# Patient Record
Sex: Female | Born: 1955
Health system: Southern US, Community
[De-identification: ages and names within clinical notes are randomized; demographics above are authoritative.]

## PROBLEM LIST (undated history)

## (undated) DIAGNOSIS — E785 Hyperlipidemia, unspecified: Secondary | ICD-10-CM

## (undated) DIAGNOSIS — F331 Major depressive disorder, recurrent, moderate: Secondary | ICD-10-CM

## (undated) DIAGNOSIS — R5382 Chronic fatigue, unspecified: Secondary | ICD-10-CM

## (undated) DIAGNOSIS — I10 Essential (primary) hypertension: Secondary | ICD-10-CM

## (undated) DIAGNOSIS — K589 Irritable bowel syndrome without diarrhea: Secondary | ICD-10-CM

## (undated) DIAGNOSIS — E113293 Type 2 diabetes mellitus with mild nonproliferative diabetic retinopathy without macular edema, bilateral: Secondary | ICD-10-CM

## (undated) DIAGNOSIS — M1990 Unspecified osteoarthritis, unspecified site: Secondary | ICD-10-CM

## (undated) DIAGNOSIS — E119 Type 2 diabetes mellitus without complications: Secondary | ICD-10-CM

## (undated) DIAGNOSIS — IMO0002 Reserved for concepts with insufficient information to code with codable children: Secondary | ICD-10-CM

## (undated) DIAGNOSIS — Z6841 Body Mass Index (BMI) 40.0 and over, adult: Secondary | ICD-10-CM

## (undated) DIAGNOSIS — K5792 Diverticulitis of intestine, part unspecified, without perforation or abscess without bleeding: Secondary | ICD-10-CM

## (undated) DIAGNOSIS — J309 Allergic rhinitis, unspecified: Secondary | ICD-10-CM

## (undated) DIAGNOSIS — M329 Systemic lupus erythematosus, unspecified: Secondary | ICD-10-CM

## (undated) HISTORY — DX: Morbid (severe) obesity due to excess calories: E66.01

## (undated) HISTORY — DX: Type 2 diabetes mellitus with mild nonproliferative diabetic retinopathy without macular edema, bilateral: E11.3293

## (undated) HISTORY — DX: Chronic fatigue, unspecified: R53.82

## (undated) HISTORY — PX: MYOMECTOMY: SHX85

## (undated) HISTORY — DX: Diverticulitis of intestine, part unspecified, without perforation or abscess without bleeding: K57.92

## (undated) HISTORY — DX: Irritable bowel syndrome, unspecified: K58.9

## (undated) HISTORY — DX: Hyperlipidemia, unspecified: E78.5

## (undated) HISTORY — PX: CHOLECYSTECTOMY: SHX55

## (undated) HISTORY — DX: Essential (primary) hypertension: I10

## (undated) HISTORY — DX: Major depressive disorder, recurrent, moderate: F33.1

## (undated) HISTORY — DX: Unspecified osteoarthritis, unspecified site: M19.90

## (undated) HISTORY — DX: Systemic lupus erythematosus, unspecified: M32.9

## (undated) HISTORY — DX: Allergic rhinitis, unspecified: J30.9

## (undated) HISTORY — DX: Body Mass Index (BMI) 40.0 and over, adult: Z684

## (undated) HISTORY — PX: TONSILLECTOMY: SUR1361

## (undated) HISTORY — PX: DIAGNOSTIC LAPAROSCOPY: SUR761

## (undated) HISTORY — DX: Reserved for concepts with insufficient information to code with codable children: IMO0002

---

## 2003-12-05 ENCOUNTER — Emergency Department (HOSPITAL_COMMUNITY): Admission: EM | Admit: 2003-12-05 | Discharge: 2003-12-06 | Payer: Self-pay | Admitting: Emergency Medicine

## 2003-12-21 ENCOUNTER — Encounter: Admission: RE | Admit: 2003-12-21 | Discharge: 2003-12-21 | Payer: Self-pay | Admitting: Internal Medicine

## 2004-01-09 ENCOUNTER — Encounter: Admission: RE | Admit: 2004-01-09 | Discharge: 2004-01-09 | Payer: Self-pay | Admitting: Internal Medicine

## 2004-03-07 ENCOUNTER — Ambulatory Visit: Admission: RE | Admit: 2004-03-07 | Discharge: 2004-03-07 | Payer: Self-pay | Admitting: Internal Medicine

## 2004-09-09 ENCOUNTER — Encounter: Admission: RE | Admit: 2004-09-09 | Discharge: 2004-09-09 | Payer: Self-pay | Admitting: Internal Medicine

## 2004-10-01 ENCOUNTER — Encounter: Admission: RE | Admit: 2004-10-01 | Discharge: 2004-10-01 | Payer: Self-pay | Admitting: Internal Medicine

## 2004-10-07 ENCOUNTER — Ambulatory Visit (HOSPITAL_COMMUNITY): Admission: RE | Admit: 2004-10-07 | Discharge: 2004-10-07 | Payer: Self-pay | Admitting: Gastroenterology

## 2004-10-07 ENCOUNTER — Encounter (INDEPENDENT_AMBULATORY_CARE_PROVIDER_SITE_OTHER): Payer: Self-pay | Admitting: *Deleted

## 2005-01-05 ENCOUNTER — Encounter: Admission: RE | Admit: 2005-01-05 | Discharge: 2005-01-05 | Payer: Self-pay | Admitting: Internal Medicine

## 2005-01-27 ENCOUNTER — Encounter: Admission: RE | Admit: 2005-01-27 | Discharge: 2005-04-27 | Payer: Self-pay | Admitting: Internal Medicine

## 2006-01-12 ENCOUNTER — Emergency Department (HOSPITAL_COMMUNITY): Admission: EM | Admit: 2006-01-12 | Discharge: 2006-01-12 | Payer: Self-pay | Admitting: Family Medicine

## 2006-01-22 ENCOUNTER — Encounter: Admission: RE | Admit: 2006-01-22 | Discharge: 2006-01-22 | Payer: Self-pay | Admitting: Internal Medicine

## 2006-03-28 ENCOUNTER — Encounter: Admission: RE | Admit: 2006-03-28 | Discharge: 2006-03-28 | Payer: Self-pay | Admitting: Orthopedic Surgery

## 2006-03-31 ENCOUNTER — Encounter: Admission: RE | Admit: 2006-03-31 | Discharge: 2006-03-31 | Payer: Self-pay | Admitting: Orthopedic Surgery

## 2006-08-19 ENCOUNTER — Encounter: Admission: RE | Admit: 2006-08-19 | Discharge: 2006-08-19 | Payer: Self-pay | Admitting: Rheumatology

## 2006-08-31 ENCOUNTER — Encounter: Admission: RE | Admit: 2006-08-31 | Discharge: 2006-08-31 | Payer: Self-pay | Admitting: *Deleted

## 2007-10-10 ENCOUNTER — Encounter: Admission: RE | Admit: 2007-10-10 | Discharge: 2007-10-10 | Payer: Self-pay | Admitting: Rheumatology

## 2007-10-19 ENCOUNTER — Encounter: Admission: RE | Admit: 2007-10-19 | Discharge: 2007-10-19 | Payer: Self-pay | Admitting: Family Medicine

## 2008-06-13 ENCOUNTER — Encounter: Admission: RE | Admit: 2008-06-13 | Discharge: 2008-07-11 | Payer: Self-pay | Admitting: Endocrinology

## 2008-09-03 ENCOUNTER — Encounter: Admission: RE | Admit: 2008-09-03 | Discharge: 2008-09-03 | Payer: Self-pay | Admitting: Internal Medicine

## 2008-12-19 ENCOUNTER — Other Ambulatory Visit: Admission: RE | Admit: 2008-12-19 | Discharge: 2008-12-19 | Payer: Self-pay | Admitting: Internal Medicine

## 2009-05-14 ENCOUNTER — Emergency Department (HOSPITAL_COMMUNITY): Admission: EM | Admit: 2009-05-14 | Discharge: 2009-05-14 | Payer: Self-pay | Admitting: Emergency Medicine

## 2009-08-29 ENCOUNTER — Encounter: Admission: RE | Admit: 2009-08-29 | Discharge: 2009-08-29 | Payer: Self-pay | Admitting: Orthopedic Surgery

## 2010-05-21 ENCOUNTER — Encounter
Admission: RE | Admit: 2010-05-21 | Discharge: 2010-05-21 | Payer: Self-pay | Source: Home / Self Care | Attending: Internal Medicine | Admitting: Internal Medicine

## 2010-05-31 LAB — LAB REPORT - SCANNED: EGFR: 60

## 2010-09-12 NOTE — Op Note (Signed)
Stacy Dickerson, Stacy Dickerson                 ACCOUNT NO.:  [id number]   MEDICAL RECORD NO.:  [id number]          PATIENT TYPE:  AMB   LOCATION:  ENDO                         FACILITY:  [facility]   PHYSICIAN:  James L. Malon Kindle., M.D.DATE OF BIRTH:  12-13-1955   DATE OF PROCEDURE:  DATE OF DISCHARGE:                                 OPERATIVE REPORT   Audio too short to transcribe (less than 5 seconds)       JLE/MEDQ  D:  10/07/2004  T:  10/07/2004  Job:  [id number]

## 2010-09-12 NOTE — Op Note (Signed)
NAMEKEVIA, Stacy Dickerson                 ACCOUNT NO.:  [id number]   MEDICAL RECORD NO.:  [id number]          PATIENT TYPE:  AMB   LOCATION:  ENDO                         FACILITY:  [facility]   PHYSICIAN:  James L. Malon Kindle., M.D.DATE OF BIRTH:  1955/07/19   DATE OF PROCEDURE:  10/07/2004  DATE OF DISCHARGE:                                 OPERATIVE REPORT   PROCEDURE:  Colonoscopy and biopsy.   MEDICATIONS:  The patient received a total of fentanyl 115 mcg, Versed 1.5  mg IV.   INDICATION:  Persistent diarrhea and weight loss.  This is done to look for  a colonic source.   DESCRIPTION OF PROCEDURE:  The procedure was explained to the patient and  consent obtained.  With the patient in the left lateral decubitus position,  the Olympus pediatric adjustable scope was inserted.  Prep excellent.  We  advanced to the transverse colon.  Despite multiple maneuvers, could not  advance further.  We then withdrew the scope and inserted the adult  adjustable scope and were able to advance to the same position.  Finally  with the patient in the right lateral decubitus position, we were able to  advance down with obesity.  The scope was withdrawn and mucosa was normal  throughout.  No polyps seen.  No diverticular disease.  Multiple random  biopsies obtained.  Diarrhea grossly.  Random biopsies obtained, 787.91.   PLAN:  Will check pathology and recommend repeating office visit in 3-4  weeks.       JLE/MEDQ  D:  10/07/2004  T:  10/07/2004  Job:  [id number]   cc:   Darius Bump, M.D.  [place]  [place] [address]  Fax: [phone]

## 2010-09-12 NOTE — Op Note (Signed)
Stacy Dickerson, Stacy Dickerson                 ACCOUNT NO.:  [id number]   MEDICAL RECORD NO.:  [id number]          PATIENT TYPE:  AMB   LOCATION:  ENDO                         FACILITY:  [facility]   PHYSICIAN:  James L. Malon Kindle., M.D.DATE OF BIRTH:  1955-05-11   DATE OF PROCEDURE:  10/07/2004  DATE OF DISCHARGE:                                 OPERATIVE REPORT   PROCEDURE:  Esophagogastroduodenoscopy.   MEDICATIONS:  Fentanyl 75 mcg and Versed 8 mg IV.   INDICATIONS FOR PROCEDURE:  Persistent dyspepsia despite the use of Nexium.   DESCRIPTION OF PROCEDURE:  Procedure had been explained to the patient and  consent obtained.  In the left lateral decubitus position, the Olympus scope  was inserted and advanced.  The prep was excellent.  Stomach entered.  Pylorus identified and passed.  Duodenum including the bulb and second  portion were unremarkable.  Pyloric channel completely normal.  Antrum and  body of the stomach normal.  Fundus and cardia seen well on the retroflexed  view and are normal.  A 2 to 3 cm hiatal hernia distal proximal esophagus  endoscopically normal.  No signs of Barrett's esophagus.  Scope withdrawn.  Patient tolerated the procedure well.   ASSESSMENT:  Gastroesophageal reflux disease, 530.81.   PLAN:  Will continue Nexium, give reflux instruction sheet and proceed with  colonoscopy at this time.       JLE/MEDQ  D:  10/07/2004  T:  10/07/2004  Job:  [id number]   cc:   Darius Bump, M.D.  [place]  [place] [address]  Fax: [phone]

## 2011-03-06 ENCOUNTER — Other Ambulatory Visit: Payer: Self-pay | Admitting: Internal Medicine

## 2011-03-06 DIAGNOSIS — R1900 Intra-abdominal and pelvic swelling, mass and lump, unspecified site: Secondary | ICD-10-CM

## 2011-03-11 ENCOUNTER — Ambulatory Visit
Admission: RE | Admit: 2011-03-11 | Discharge: 2011-03-11 | Disposition: A | Discharge status: Home / Self Care | Payer: 59 | Source: Ambulatory Visit | Attending: Internal Medicine | Admitting: Internal Medicine

## 2011-03-11 DIAGNOSIS — R1900 Intra-abdominal and pelvic swelling, mass and lump, unspecified site: Secondary | ICD-10-CM

## 2011-04-14 ENCOUNTER — Other Ambulatory Visit: Payer: Self-pay | Admitting: Pulmonary Disease

## 2011-04-14 ENCOUNTER — Other Ambulatory Visit: Payer: Self-pay | Admitting: Internal Medicine

## 2011-04-14 DIAGNOSIS — Z1231 Encounter for screening mammogram for malignant neoplasm of breast: Secondary | ICD-10-CM

## 2011-05-12 ENCOUNTER — Other Ambulatory Visit: Payer: Self-pay | Admitting: Occupational Medicine

## 2011-05-12 ENCOUNTER — Other Ambulatory Visit: Payer: Self-pay | Admitting: *Deleted

## 2011-05-12 ENCOUNTER — Ambulatory Visit
Admission: RE | Admit: 2011-05-12 | Discharge: 2011-05-12 | Disposition: A | Discharge status: Home / Self Care | Payer: 59 | Source: Ambulatory Visit | Attending: *Deleted | Admitting: *Deleted

## 2011-05-12 DIAGNOSIS — M25572 Pain in left ankle and joints of left foot: Secondary | ICD-10-CM

## 2011-05-25 ENCOUNTER — Ambulatory Visit
Admission: RE | Admit: 2011-05-25 | Discharge: 2011-05-25 | Disposition: A | Discharge status: Home / Self Care | Payer: 59 | Source: Ambulatory Visit | Attending: Internal Medicine | Admitting: Internal Medicine

## 2011-05-25 DIAGNOSIS — Z1231 Encounter for screening mammogram for malignant neoplasm of breast: Secondary | ICD-10-CM

## 2012-05-05 ENCOUNTER — Other Ambulatory Visit: Payer: Self-pay | Admitting: Internal Medicine

## 2012-05-05 DIAGNOSIS — Z1231 Encounter for screening mammogram for malignant neoplasm of breast: Secondary | ICD-10-CM

## 2012-05-25 ENCOUNTER — Ambulatory Visit: Payer: 59

## 2012-05-31 ENCOUNTER — Other Ambulatory Visit (HOSPITAL_COMMUNITY)
Admission: RE | Admit: 2012-05-31 | Discharge: 2012-05-31 | Disposition: A | Discharge status: Home / Self Care | Payer: 59 | Source: Ambulatory Visit | Attending: Internal Medicine | Admitting: Internal Medicine

## 2012-05-31 ENCOUNTER — Other Ambulatory Visit: Payer: Self-pay | Admitting: Registered Nurse

## 2012-05-31 ENCOUNTER — Ambulatory Visit
Admission: RE | Admit: 2012-05-31 | Discharge: 2012-05-31 | Disposition: A | Discharge status: Home / Self Care | Payer: 59 | Source: Ambulatory Visit | Attending: Internal Medicine | Admitting: Internal Medicine

## 2012-05-31 DIAGNOSIS — Z01419 Encounter for gynecological examination (general) (routine) without abnormal findings: Secondary | ICD-10-CM | POA: Insufficient documentation

## 2012-05-31 DIAGNOSIS — Z1231 Encounter for screening mammogram for malignant neoplasm of breast: Secondary | ICD-10-CM

## 2013-04-27 HISTORY — PX: BREAST BIOPSY: SHX20

## 2013-05-12 ENCOUNTER — Other Ambulatory Visit: Payer: Self-pay

## 2013-05-12 DIAGNOSIS — Z1231 Encounter for screening mammogram for malignant neoplasm of breast: Secondary | ICD-10-CM

## 2013-06-02 ENCOUNTER — Ambulatory Visit
Admission: RE | Admit: 2013-06-02 | Discharge: 2013-06-02 | Disposition: A | Discharge status: Home / Self Care | Payer: 59 | Source: Ambulatory Visit

## 2013-06-02 DIAGNOSIS — Z1231 Encounter for screening mammogram for malignant neoplasm of breast: Secondary | ICD-10-CM

## 2013-06-06 ENCOUNTER — Other Ambulatory Visit: Payer: Self-pay | Admitting: Family Medicine

## 2013-06-06 DIAGNOSIS — R928 Other abnormal and inconclusive findings on diagnostic imaging of breast: Secondary | ICD-10-CM

## 2013-06-09 ENCOUNTER — Ambulatory Visit
Admission: RE | Admit: 2013-06-09 | Discharge: 2013-06-09 | Disposition: A | Discharge status: Home / Self Care | Payer: 59 | Source: Ambulatory Visit | Attending: Family Medicine | Admitting: Family Medicine

## 2013-06-09 ENCOUNTER — Other Ambulatory Visit: Payer: Self-pay | Admitting: Family Medicine

## 2013-06-09 DIAGNOSIS — R928 Other abnormal and inconclusive findings on diagnostic imaging of breast: Secondary | ICD-10-CM

## 2013-06-09 DIAGNOSIS — R2230 Localized swelling, mass and lump, unspecified upper limb: Secondary | ICD-10-CM

## 2013-06-13 ENCOUNTER — Other Ambulatory Visit (HOSPITAL_COMMUNITY): Payer: Self-pay | Admitting: Diagnostic Radiology

## 2013-06-13 ENCOUNTER — Ambulatory Visit
Admission: RE | Admit: 2013-06-13 | Discharge: 2013-06-13 | Disposition: A | Discharge status: Home / Self Care | Payer: 59 | Source: Ambulatory Visit | Attending: Family Medicine | Admitting: Family Medicine

## 2013-06-13 DIAGNOSIS — R2230 Localized swelling, mass and lump, unspecified upper limb: Secondary | ICD-10-CM

## 2013-06-23 ENCOUNTER — Other Ambulatory Visit: Payer: Self-pay | Admitting: Family Medicine

## 2013-06-23 ENCOUNTER — Ambulatory Visit
Admission: RE | Admit: 2013-06-23 | Discharge: 2013-06-23 | Disposition: A | Discharge status: Home / Self Care | Payer: 59 | Source: Ambulatory Visit | Attending: Family Medicine | Admitting: Family Medicine

## 2013-06-23 DIAGNOSIS — R2230 Localized swelling, mass and lump, unspecified upper limb: Secondary | ICD-10-CM

## 2014-06-12 ENCOUNTER — Other Ambulatory Visit: Payer: Self-pay

## 2014-06-12 DIAGNOSIS — Z1231 Encounter for screening mammogram for malignant neoplasm of breast: Secondary | ICD-10-CM

## 2014-06-25 ENCOUNTER — Ambulatory Visit
Admission: RE | Admit: 2014-06-25 | Discharge: 2014-06-25 | Disposition: A | Discharge status: Home / Self Care | Payer: 59 | Source: Ambulatory Visit

## 2014-06-25 DIAGNOSIS — Z1231 Encounter for screening mammogram for malignant neoplasm of breast: Secondary | ICD-10-CM

## 2014-07-20 LAB — LAB REPORT - SCANNED: EGFR: 89

## 2014-08-13 ENCOUNTER — Other Ambulatory Visit: Payer: Self-pay | Admitting: Rheumatology

## 2014-08-13 ENCOUNTER — Ambulatory Visit (HOSPITAL_COMMUNITY)
Admission: RE | Admit: 2014-08-13 | Discharge: 2014-08-13 | Disposition: A | Discharge status: Home / Self Care | Payer: 59 | Source: Ambulatory Visit | Attending: Rheumatology | Admitting: Rheumatology

## 2014-08-13 DIAGNOSIS — R748 Abnormal levels of other serum enzymes: Secondary | ICD-10-CM

## 2014-08-13 DIAGNOSIS — R599 Enlarged lymph nodes, unspecified: Secondary | ICD-10-CM

## 2014-08-13 DIAGNOSIS — R591 Generalized enlarged lymph nodes: Secondary | ICD-10-CM | POA: Insufficient documentation

## 2014-08-13 DIAGNOSIS — R749 Abnormal serum enzyme level, unspecified: Secondary | ICD-10-CM | POA: Diagnosis not present

## 2014-11-28 ENCOUNTER — Other Ambulatory Visit: Payer: Self-pay | Admitting: Family Medicine

## 2014-11-28 ENCOUNTER — Ambulatory Visit
Admission: RE | Admit: 2014-11-28 | Discharge: 2014-11-28 | Disposition: A | Discharge status: Home / Self Care | Payer: 59 | Source: Ambulatory Visit | Attending: Family Medicine | Admitting: Family Medicine

## 2014-11-28 DIAGNOSIS — R52 Pain, unspecified: Secondary | ICD-10-CM

## 2014-12-07 ENCOUNTER — Encounter: Payer: Self-pay | Admitting: Podiatry

## 2014-12-07 ENCOUNTER — Ambulatory Visit (INDEPENDENT_AMBULATORY_CARE_PROVIDER_SITE_OTHER): Payer: 59

## 2014-12-07 ENCOUNTER — Ambulatory Visit (INDEPENDENT_AMBULATORY_CARE_PROVIDER_SITE_OTHER): Payer: 59 | Admitting: Podiatry

## 2014-12-07 VITALS — BP 127/70 | HR 71 | Resp 16 | Ht 67.0 in | Wt 295.0 lb

## 2014-12-07 DIAGNOSIS — M79672 Pain in left foot: Secondary | ICD-10-CM | POA: Diagnosis not present

## 2014-12-07 DIAGNOSIS — M779 Enthesopathy, unspecified: Secondary | ICD-10-CM

## 2014-12-07 DIAGNOSIS — Z0189 Encounter for other specified special examinations: Secondary | ICD-10-CM | POA: Diagnosis not present

## 2014-12-07 DIAGNOSIS — M722 Plantar fascial fibromatosis: Secondary | ICD-10-CM

## 2014-12-07 MED ORDER — TRIAMCINOLONE ACETONIDE 10 MG/ML IJ SUSP
10.0000 mg | Freq: Once | INTRAMUSCULAR | Status: AC
  Administered 2014-12-07: 10 mg

## 2014-12-07 NOTE — Progress Notes (Signed)
   Subjective:    Patient ID: Stacy Dickerson, female    DOB: 1956/04/20, 59 y.o.   MRN: [id number]  HPI Patient presents with foot pain, left foot, heel and ankle pain. Ankle pain has been going on for past 3 weeks. Heel pain going on for more than 2 months.   Review of Systems  Constitutional: Positive for chills, diaphoresis, fatigue and unexpected weight change.  Gastrointestinal: Positive for diarrhea and abdominal distention.  Endocrine: Positive for polydipsia.  Musculoskeletal: Positive for back pain, arthralgias and gait problem.  Allergic/Immunologic: Positive for environmental allergies.  Neurological: Positive for light-headedness.  All other systems reviewed and are negative.      Objective:   Physical Exam        Assessment & Plan:

## 2014-12-07 NOTE — Patient Instructions (Signed)

## 2014-12-09 NOTE — Progress Notes (Signed)
Subjective:     Patient ID: Stacy Dickerson, female   DOB: 1955-08-19, 59 y.o.   MRN: [id number]  HPI patient presents stating she's been getting a lot of pain in her left heel her left ankle in her left foot that's been going on for several months with a worsening over the last month. States she does not remember specific injury   Review of Systems  All other systems reviewed and are negative.      Objective:   Physical Exam  Constitutional: She is oriented to person, place, and time.  Cardiovascular: Intact distal pulses.   Musculoskeletal: Normal range of motion.  Neurological: She is oriented to person, place, and time.  Skin: Skin is warm.  Nursing note and vitals reviewed.  neurovascular status intact muscle strength adequate range of motion was mildly restricted on the left side due to discomfort. Patient has mild equinus condition good digital perfusion is well oriented 3 and I noted quite a bit of discomfort in the left heel left lateral foot and also into the left lateral ankle. Patient does not again remember specific injury     Assessment:     Inflammatory fasciitis left along with tendinitis-like condition possible ankle sprain or other osteoarthritic possibility    Plan:     H&P and multiple view x-rays reviewed. Today I injected the plantar fascia 3 mg Kenalog 5 mill grams Xylocaine to reduce inflammation and placed patient into an air fracture walker and order to mobilize. Reappoint for [place] to recheck again in the next 4 weeks or earlier if any issues should occur

## 2015-01-07 ENCOUNTER — Ambulatory Visit (INDEPENDENT_AMBULATORY_CARE_PROVIDER_SITE_OTHER): Payer: 59 | Admitting: Podiatry

## 2015-01-07 VITALS — BP 114/67 | HR 68 | Resp 16

## 2015-01-07 DIAGNOSIS — M779 Enthesopathy, unspecified: Secondary | ICD-10-CM

## 2015-01-07 DIAGNOSIS — M722 Plantar fascial fibromatosis: Secondary | ICD-10-CM

## 2015-01-07 MED ORDER — TRIAMCINOLONE ACETONIDE 10 MG/ML IJ SUSP
10.0000 mg | Freq: Once | INTRAMUSCULAR | Status: AC
  Administered 2015-01-07: 10 mg

## 2015-01-09 NOTE — Progress Notes (Signed)
Subjective:     Patient ID: Stacy Dickerson, female   DOB: 10-02-1955, 59 y.o.   MRN: [id number]  HPI patient presents stating the bottom of my heel is improved but I'm having pain in my ankle left and the boot helps but I'm still getting the discomfort if I have been very active   Review of Systems     Objective:   Physical Exam Neurovascular status intact muscle strength adequate range of motion within normal limits. Patient's noted to have inflammatory changes in the left posterior tibial tendon but upon checking of muscle strength I found to be normal with inflammation as it comes under the medial malleolus and inserts into the navicular. The heel itself is feeling much better    Assessment:     Posterior tibial tendinitis which has improved but is still present with improvement plantar fasciitis    Plan:     Explained the relationship of the 2 conditions and did a careful sheath injection left 3 mg Kenalog 5 mg Xylocaine advised on reduced activity and if the symptoms were to continue or get worse were given need to consider MRI with possibility of repairing the tendon if it turns out there is a tear within the tendon itself. Explained risk of injection prior to doing this and that it could make it worse but we will keep her immobilized for the next 3 weeks

## 2015-01-30 ENCOUNTER — Ambulatory Visit (INDEPENDENT_AMBULATORY_CARE_PROVIDER_SITE_OTHER): Payer: 59 | Admitting: Podiatry

## 2015-01-30 ENCOUNTER — Encounter: Payer: Self-pay | Admitting: Podiatry

## 2015-01-30 VITALS — BP 129/73 | HR 66 | Resp 16

## 2015-01-30 DIAGNOSIS — M722 Plantar fascial fibromatosis: Secondary | ICD-10-CM

## 2015-01-30 DIAGNOSIS — M79672 Pain in left foot: Secondary | ICD-10-CM | POA: Diagnosis not present

## 2015-01-30 DIAGNOSIS — M779 Enthesopathy, unspecified: Secondary | ICD-10-CM | POA: Diagnosis not present

## 2015-01-30 NOTE — Progress Notes (Signed)
   Subjective:    Patient ID: Stacy Dickerson, female    DOB: Sep 02, 1955, 59 y.o.   MRN: [id number]  HPI PUO on 01/30/15   Review of Systems     Objective:   Physical Exam        Assessment & Plan:

## 2015-01-30 NOTE — Progress Notes (Signed)
Subjective:     Patient ID: Stacy Dickerson, female   DOB: [birth year], 59 y.o.   MRN: [id number]  HPI patient states my heel is still they are but it has improved quite a bit over where it was and I'm having less discomfort when walking   Review of Systems     Objective:   Physical Exam Neurovascular status intact muscle strength adequate with discomfort plantar aspect left heel that's improved upon palpation    Assessment:     Planter fasciitis left inflammation fluid still noted    Plan:     Advised on physical therapy anti-inflammatory zinc continued supportive shoe gear usage. Reappoint to recheck

## 2015-03-06 ENCOUNTER — Ambulatory Visit: Payer: 59 | Admitting: Podiatry

## 2015-05-02 ENCOUNTER — Other Ambulatory Visit: Payer: Self-pay

## 2015-05-02 DIAGNOSIS — Z1231 Encounter for screening mammogram for malignant neoplasm of breast: Secondary | ICD-10-CM

## 2015-05-30 ENCOUNTER — Other Ambulatory Visit: Payer: Self-pay | Admitting: Family Medicine

## 2015-05-30 ENCOUNTER — Other Ambulatory Visit (HOSPITAL_COMMUNITY)
Admission: RE | Admit: 2015-05-30 | Discharge: 2015-05-30 | Disposition: A | Discharge status: Home / Self Care | Payer: 59 | Source: Ambulatory Visit | Attending: Family Medicine | Admitting: Family Medicine

## 2015-05-30 DIAGNOSIS — Z01419 Encounter for gynecological examination (general) (routine) without abnormal findings: Secondary | ICD-10-CM | POA: Insufficient documentation

## 2015-05-31 LAB — CYTOLOGY - PAP

## 2015-06-26 ENCOUNTER — Ambulatory Visit
Admission: RE | Admit: 2015-06-26 | Discharge: 2015-06-26 | Disposition: A | Discharge status: Home / Self Care | Payer: 59 | Source: Ambulatory Visit

## 2015-06-26 DIAGNOSIS — Z1231 Encounter for screening mammogram for malignant neoplasm of breast: Secondary | ICD-10-CM

## 2015-10-22 ENCOUNTER — Other Ambulatory Visit: Payer: Self-pay | Admitting: Family Medicine

## 2015-10-22 DIAGNOSIS — R109 Unspecified abdominal pain: Secondary | ICD-10-CM

## 2015-10-28 ENCOUNTER — Ambulatory Visit
Admission: RE | Admit: 2015-10-28 | Discharge: 2015-10-28 | Disposition: A | Discharge status: Home / Self Care | Payer: 59 | Source: Ambulatory Visit | Attending: Family Medicine | Admitting: Family Medicine

## 2015-10-28 DIAGNOSIS — R109 Unspecified abdominal pain: Secondary | ICD-10-CM

## 2015-10-28 MED ORDER — IOPAMIDOL (ISOVUE-300) INJECTION 61%
100.0000 mL | Freq: Once | INTRAVENOUS | Status: AC | PRN
  Administered 2015-10-28: 100 mL via INTRAVENOUS

## 2016-02-05 ENCOUNTER — Ambulatory Visit (INDEPENDENT_AMBULATORY_CARE_PROVIDER_SITE_OTHER): Payer: 59 | Admitting: Rheumatology

## 2016-02-05 DIAGNOSIS — M7702 Medial epicondylitis, left elbow: Secondary | ICD-10-CM | POA: Diagnosis not present

## 2016-02-05 DIAGNOSIS — E669 Obesity, unspecified: Secondary | ICD-10-CM | POA: Diagnosis not present

## 2016-02-05 DIAGNOSIS — M25551 Pain in right hip: Secondary | ICD-10-CM

## 2016-02-05 DIAGNOSIS — M25521 Pain in right elbow: Secondary | ICD-10-CM

## 2016-07-08 DIAGNOSIS — E119 Type 2 diabetes mellitus without complications: Secondary | ICD-10-CM | POA: Diagnosis not present

## 2016-07-08 DIAGNOSIS — M329 Systemic lupus erythematosus, unspecified: Secondary | ICD-10-CM | POA: Diagnosis not present

## 2016-07-31 ENCOUNTER — Other Ambulatory Visit: Payer: Self-pay | Admitting: Family Medicine

## 2016-07-31 DIAGNOSIS — Z1231 Encounter for screening mammogram for malignant neoplasm of breast: Secondary | ICD-10-CM

## 2016-08-20 ENCOUNTER — Ambulatory Visit
Admission: RE | Admit: 2016-08-20 | Discharge: 2016-08-20 | Disposition: A | Discharge status: Home / Self Care | Payer: 59 | Source: Ambulatory Visit | Attending: Family Medicine | Admitting: Family Medicine

## 2016-08-20 DIAGNOSIS — Z1231 Encounter for screening mammogram for malignant neoplasm of breast: Secondary | ICD-10-CM | POA: Diagnosis not present

## 2016-08-31 DIAGNOSIS — R1032 Left lower quadrant pain: Secondary | ICD-10-CM | POA: Diagnosis not present

## 2016-08-31 DIAGNOSIS — K5792 Diverticulitis of intestine, part unspecified, without perforation or abscess without bleeding: Secondary | ICD-10-CM | POA: Diagnosis not present

## 2016-09-02 DIAGNOSIS — Z719 Counseling, unspecified: Secondary | ICD-10-CM | POA: Diagnosis not present

## 2016-09-09 DIAGNOSIS — Z719 Counseling, unspecified: Secondary | ICD-10-CM | POA: Diagnosis not present

## 2016-09-17 DIAGNOSIS — Z719 Counseling, unspecified: Secondary | ICD-10-CM | POA: Diagnosis not present

## 2016-09-22 DIAGNOSIS — E1165 Type 2 diabetes mellitus with hyperglycemia: Secondary | ICD-10-CM | POA: Diagnosis not present

## 2016-09-22 DIAGNOSIS — E784 Other hyperlipidemia: Secondary | ICD-10-CM | POA: Diagnosis not present

## 2016-09-23 DIAGNOSIS — Z719 Counseling, unspecified: Secondary | ICD-10-CM | POA: Diagnosis not present

## 2016-09-28 DIAGNOSIS — E784 Other hyperlipidemia: Secondary | ICD-10-CM | POA: Diagnosis not present

## 2016-09-28 DIAGNOSIS — E1165 Type 2 diabetes mellitus with hyperglycemia: Secondary | ICD-10-CM | POA: Diagnosis not present

## 2016-09-28 DIAGNOSIS — I10 Essential (primary) hypertension: Secondary | ICD-10-CM | POA: Diagnosis not present

## 2016-09-30 DIAGNOSIS — Z719 Counseling, unspecified: Secondary | ICD-10-CM | POA: Diagnosis not present

## 2016-10-31 IMAGING — CR DG FOOT COMPLETE 3+V*L*
3 series · 3 of 3 positions shown · non-contrast
Comparison: None.

CLINICAL DATA: Foot pain.  No injury

EXAM:
LEFT FOOT - COMPLETE 3+ VIEW

[view not recorded (1 of 3)]
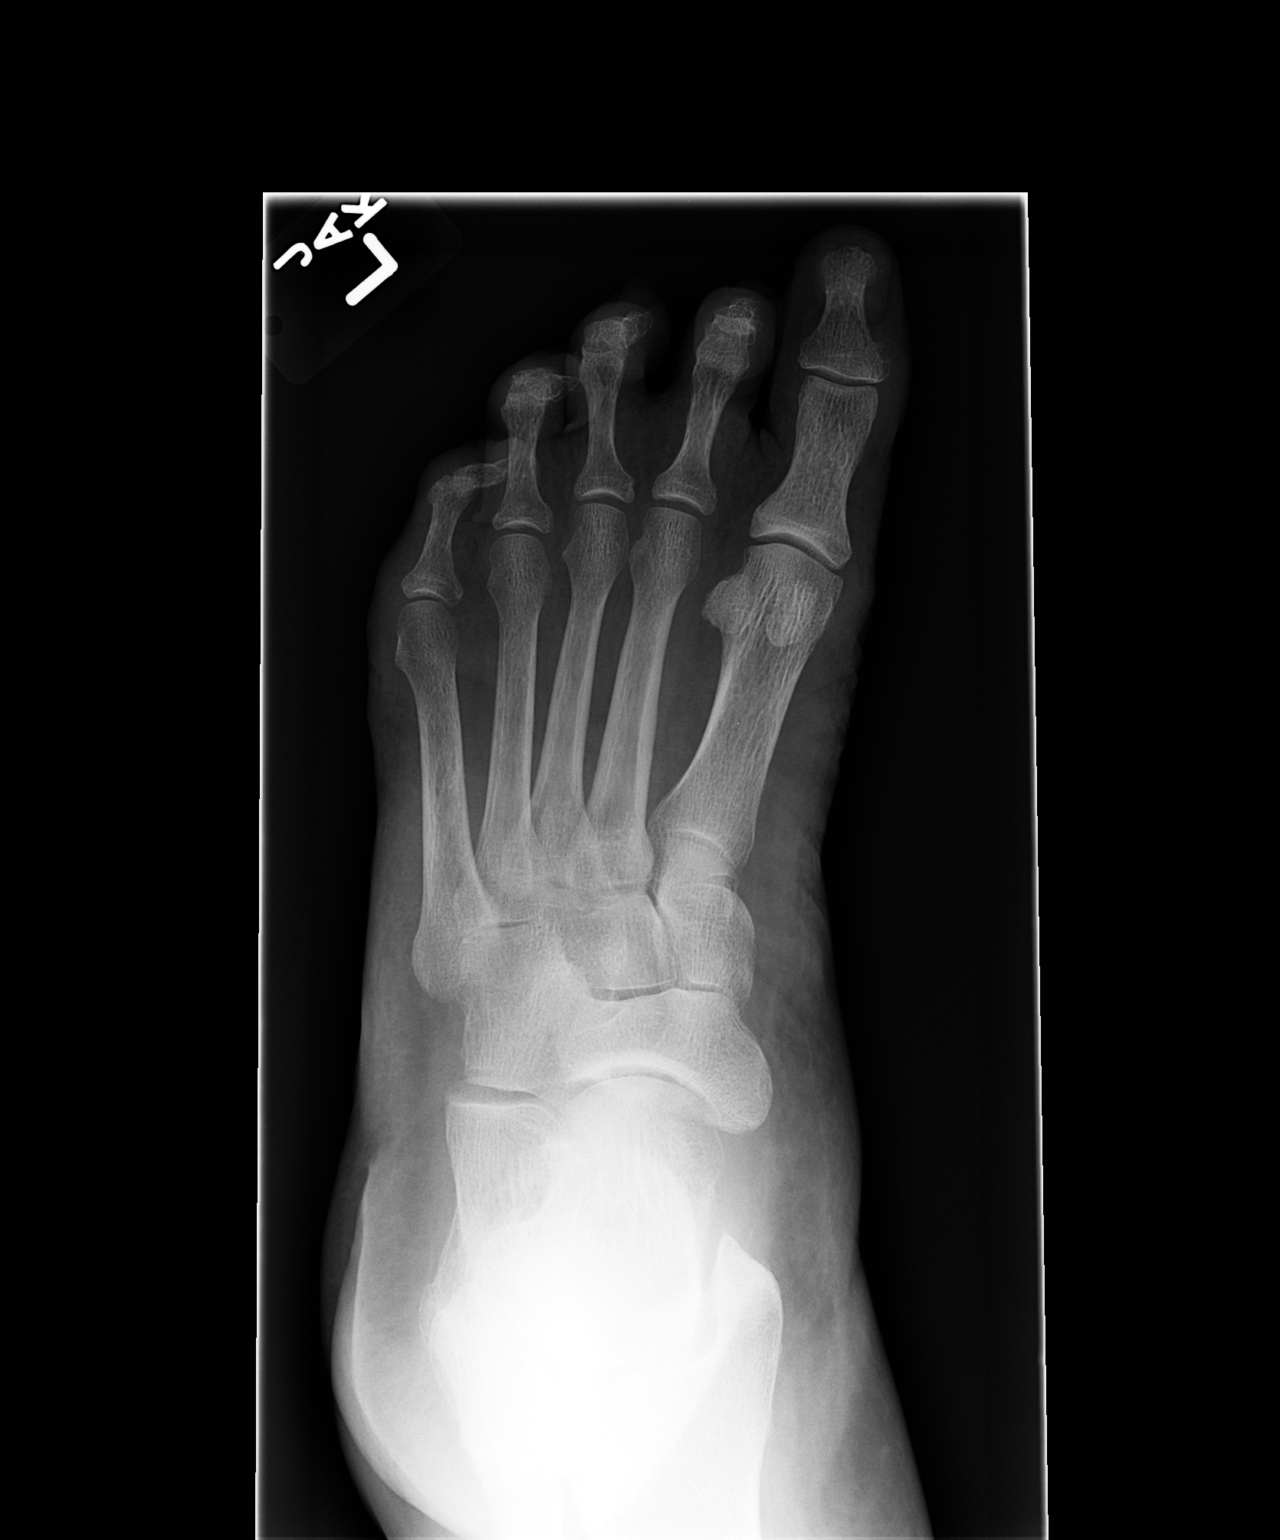

[view not recorded (2 of 3)]
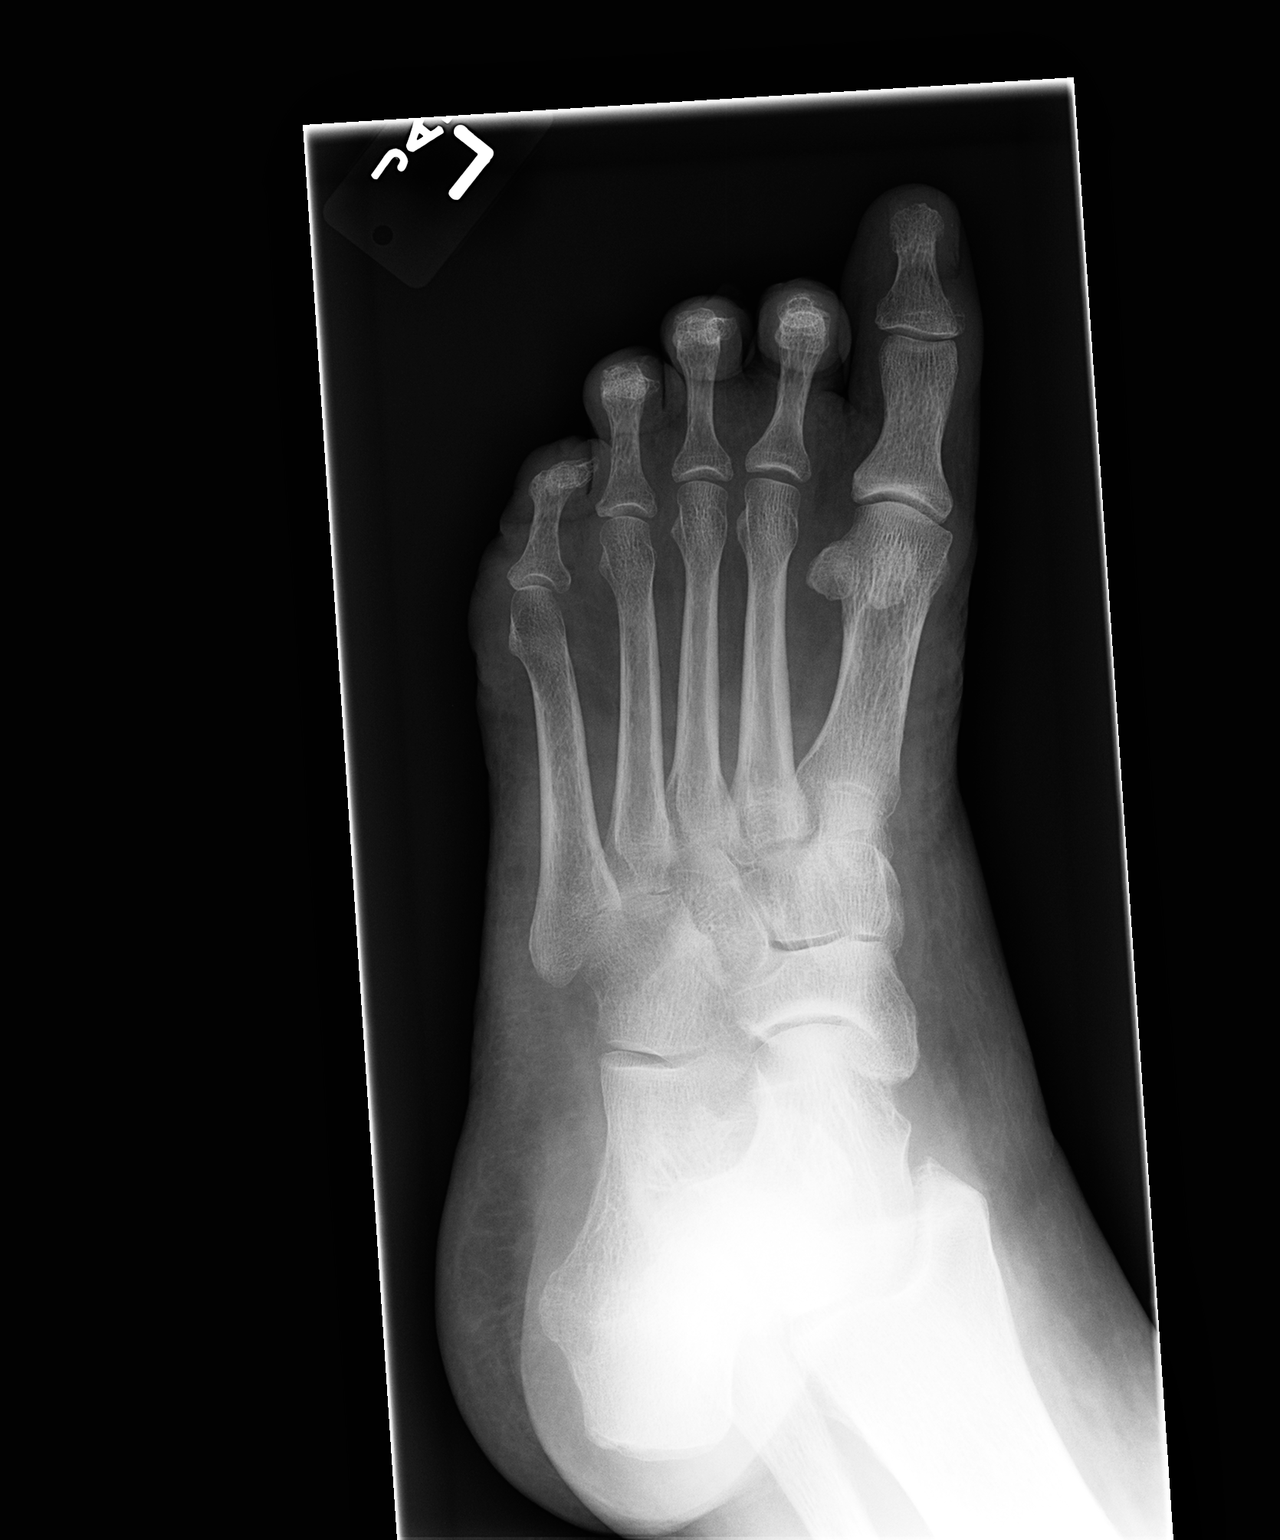

[view not recorded (3 of 3)]
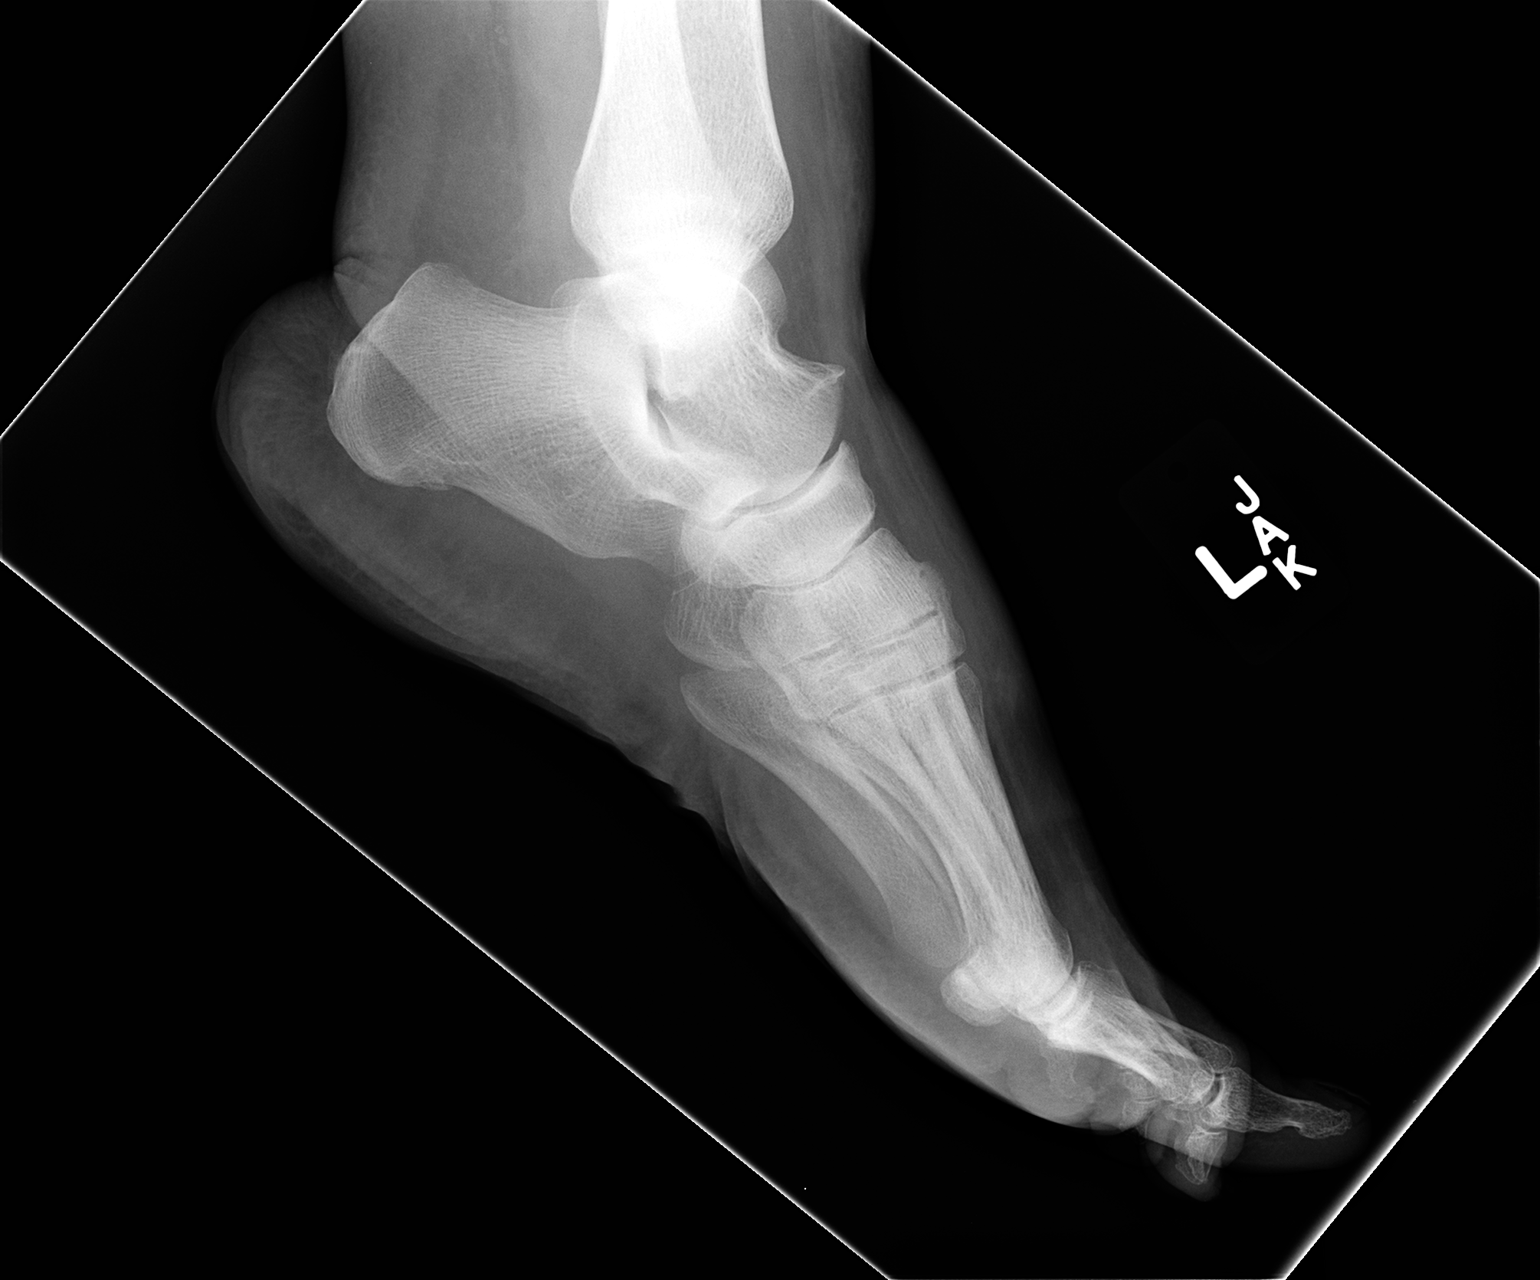

[3 of 3 positions shown; findings below may reference images not displayed]

FINDINGS: Negative for fracture. Hammertoe deformities of the second, third,
fourth, and fifth toes. No erosion or arthropathy.
IMPRESSION: No acute abnormality.

## 2016-10-31 IMAGING — CR DG ANKLE COMPLETE 3+V*L*
3 series · 3 of 3 positions shown · non-contrast
Comparison: None.

CLINICAL DATA: Left foot and ankle pain, swelling for 1-2 weeks. No
known injury. Lateral pain.

EXAM:
LEFT ANKLE COMPLETE - 3+ VIEW

[view not recorded (1 of 3)]
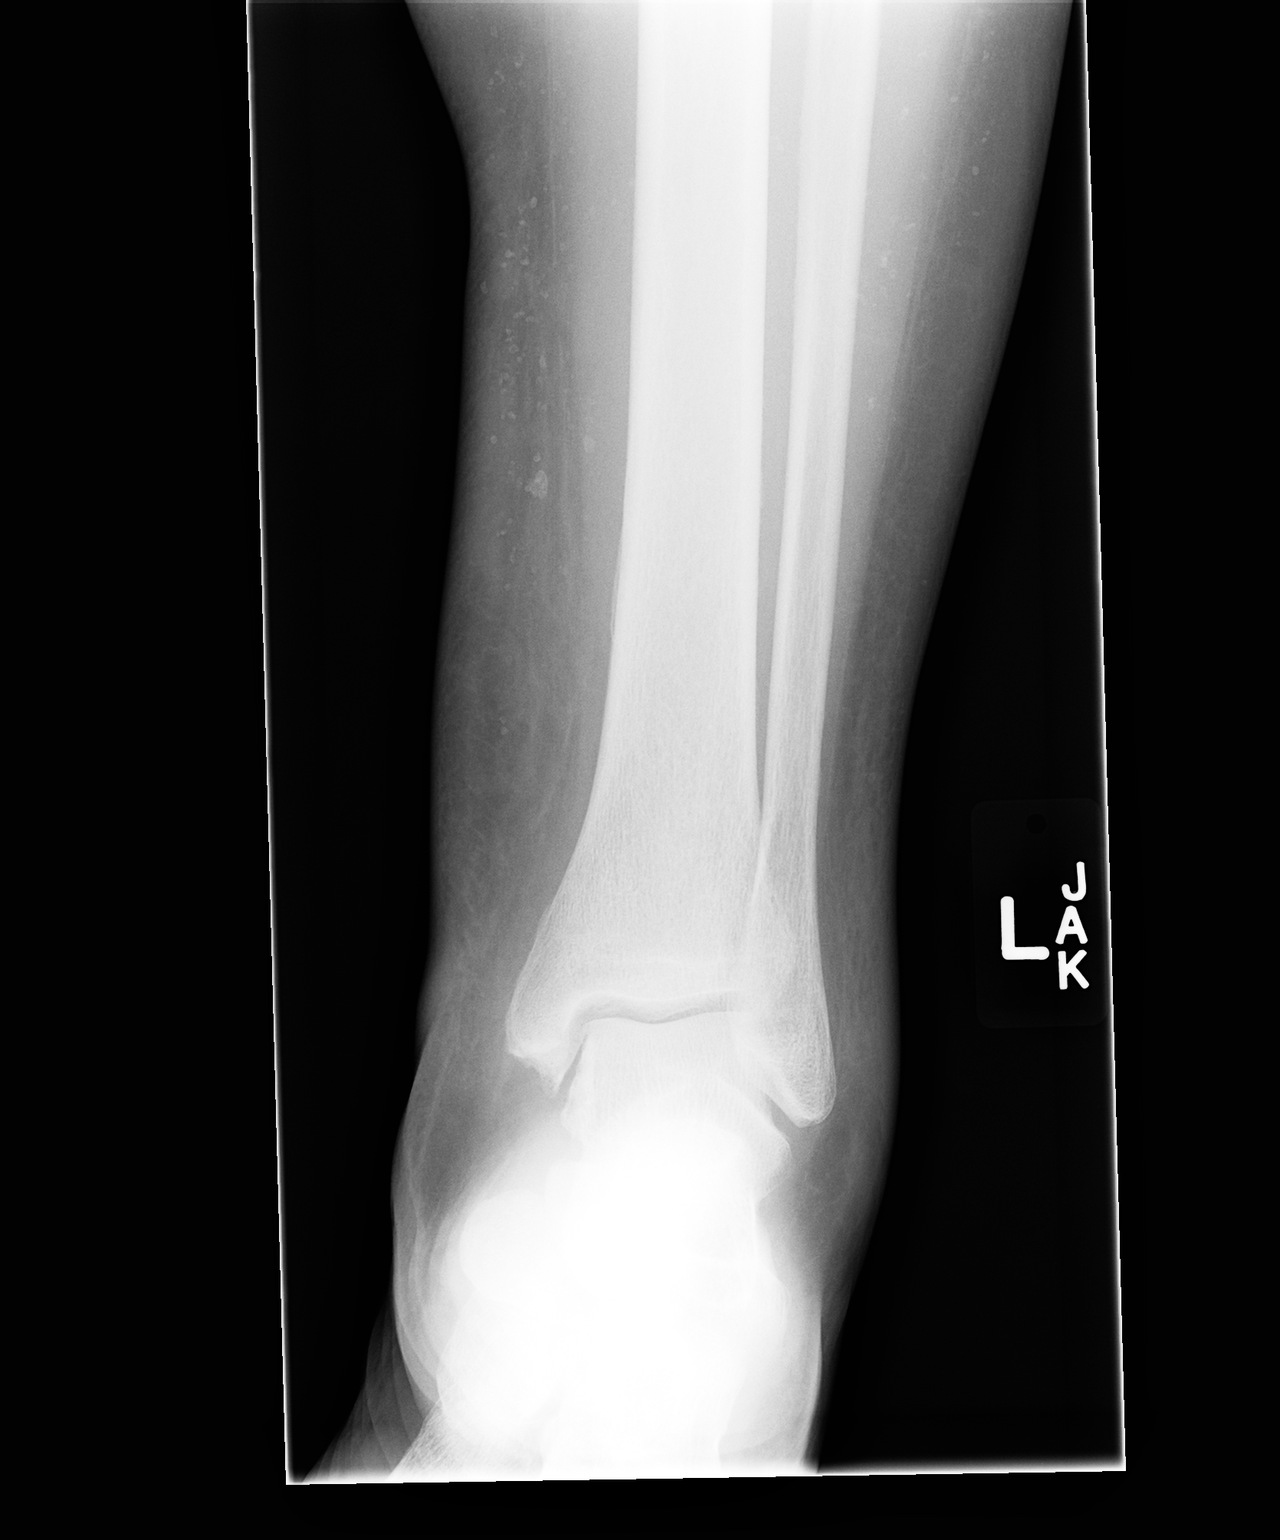

[view not recorded (2 of 3)]
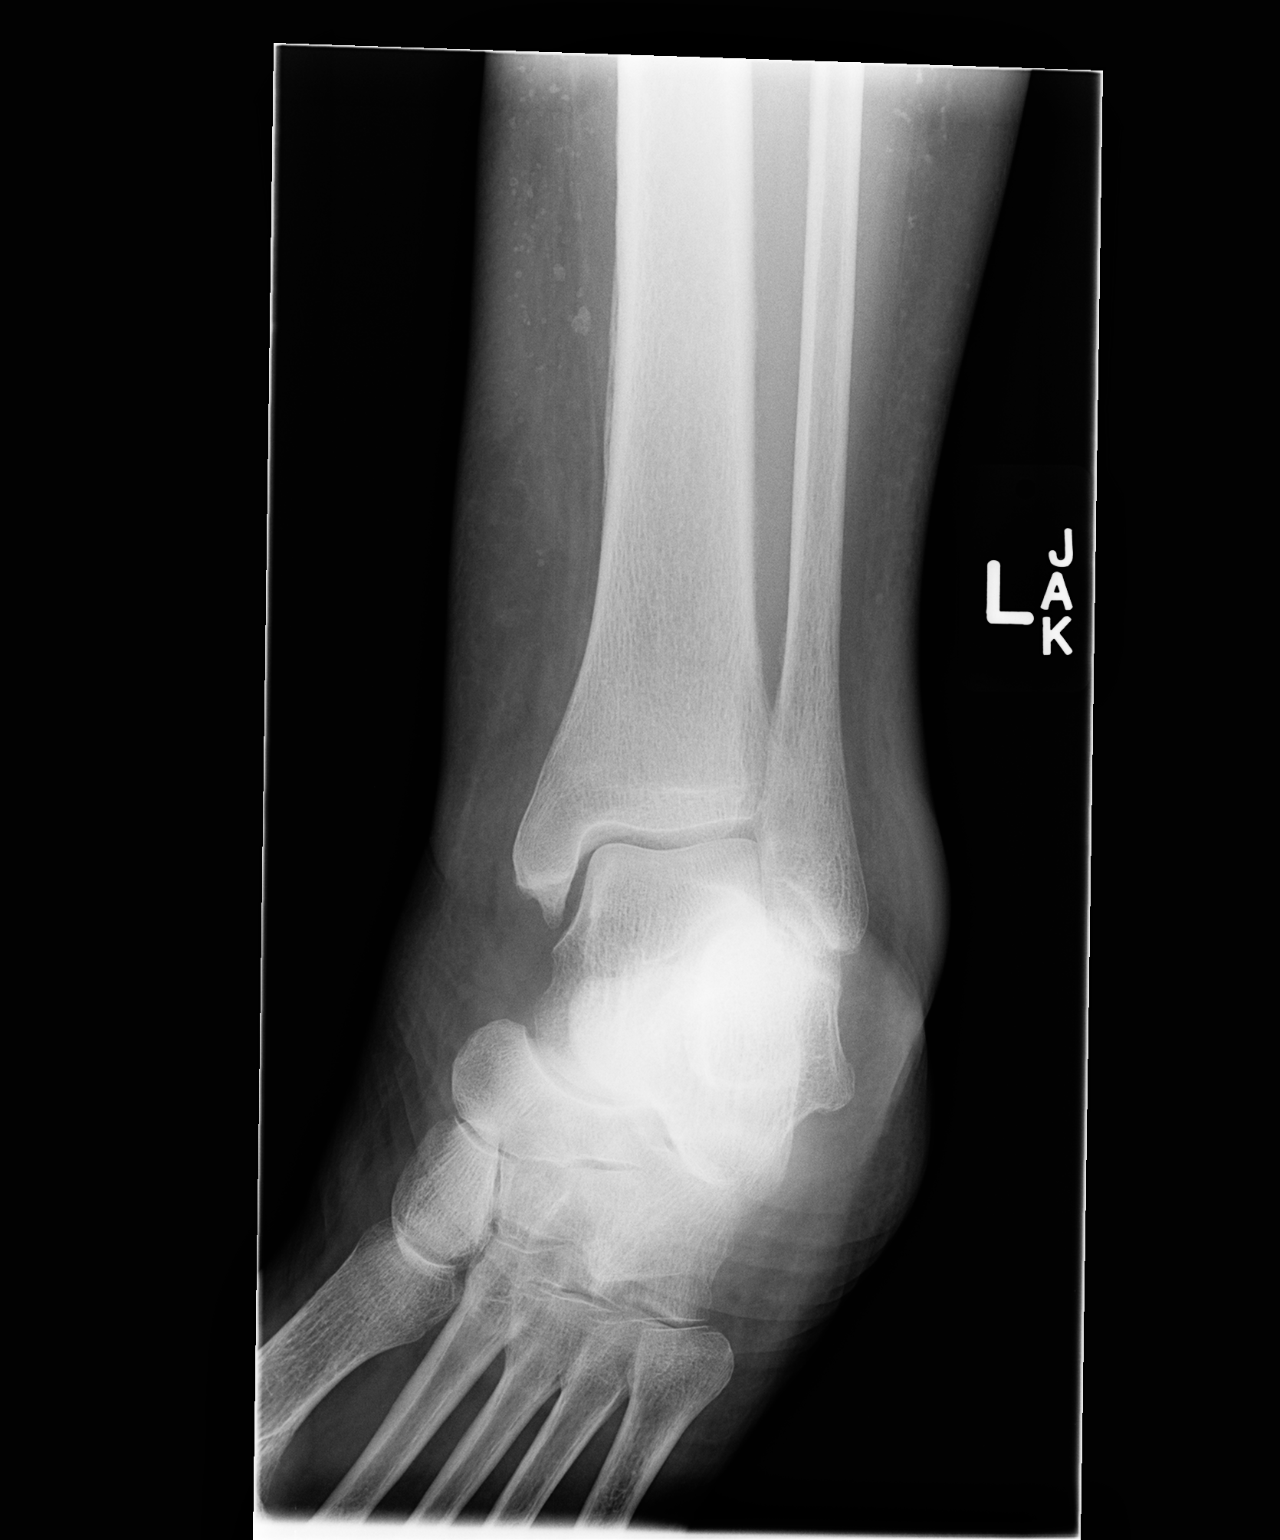

[view not recorded (3 of 3)]
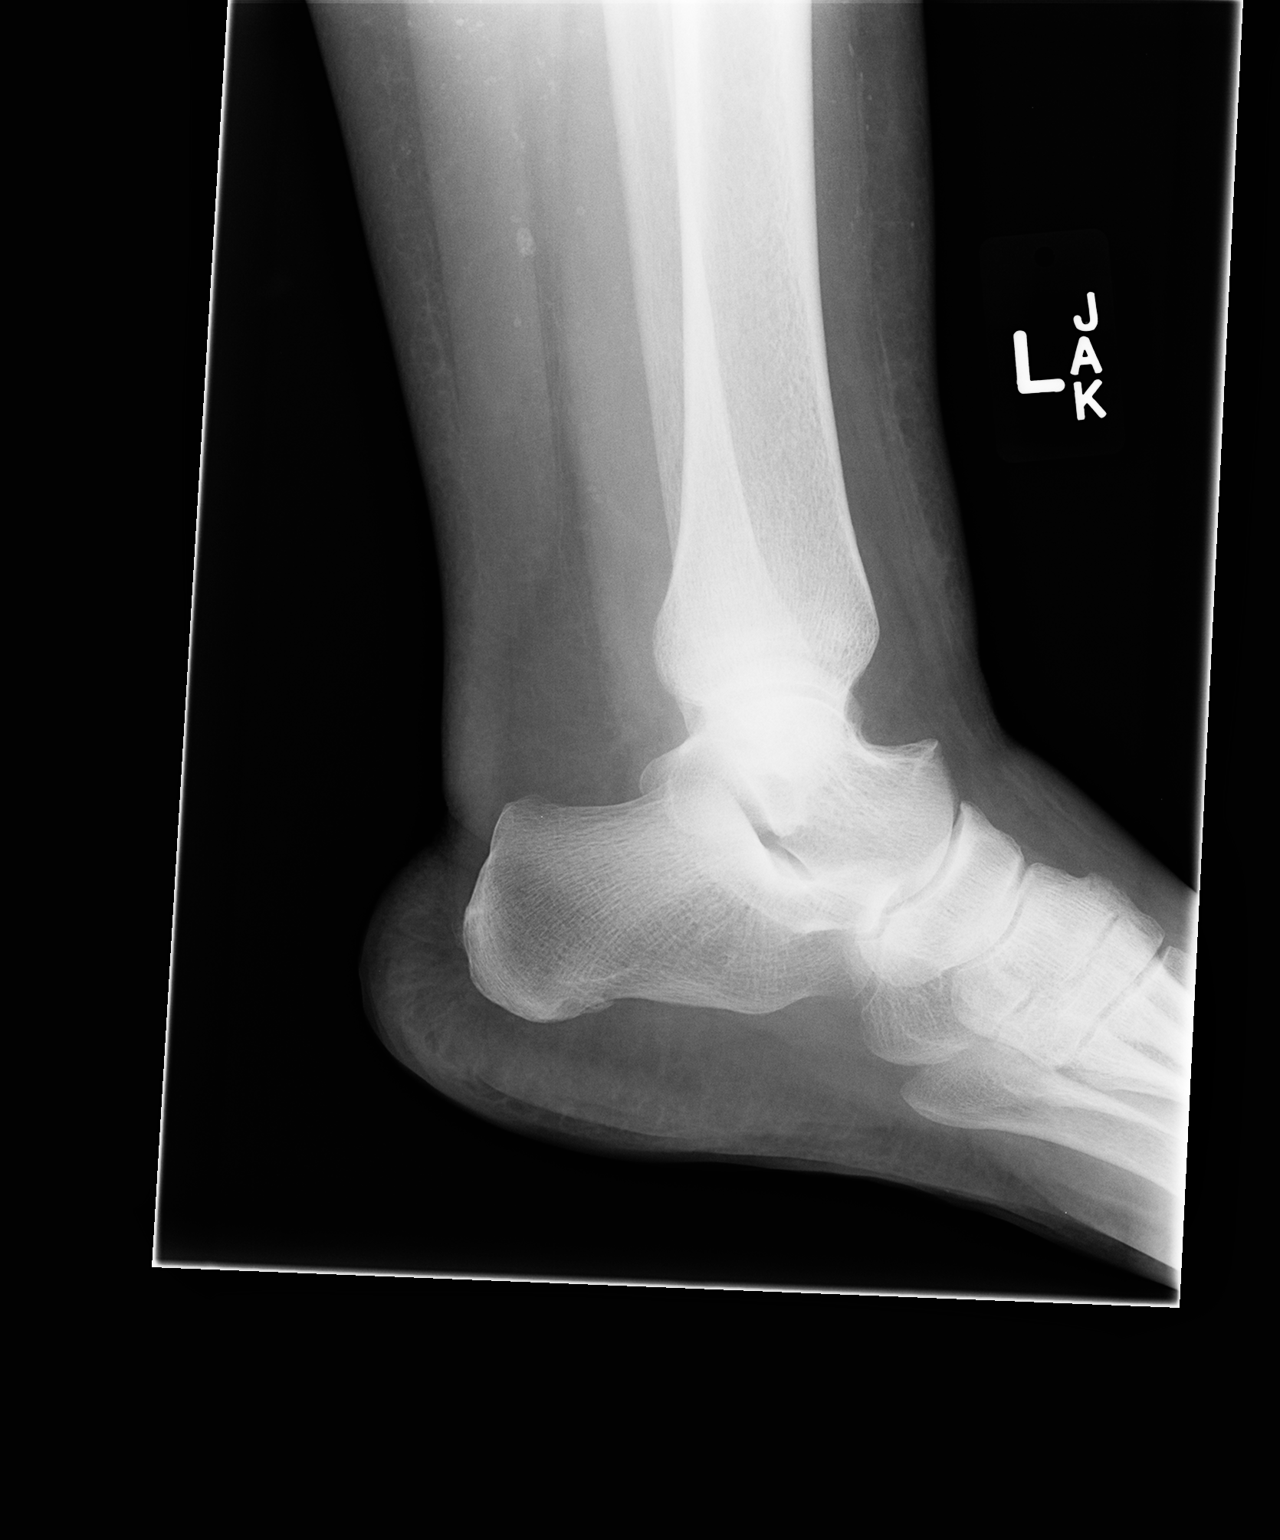

[3 of 3 positions shown; findings below may reference images not displayed]

FINDINGS: Diffuse soft tissue swelling. No acute bony abnormality.
Specifically, no fracture, subluxation, or dislocation. Soft tissues
are intact. Mild degenerative changes in the hindfoot.
IMPRESSION: No acute bony abnormality.

## 2017-01-08 DIAGNOSIS — E119 Type 2 diabetes mellitus without complications: Secondary | ICD-10-CM | POA: Diagnosis not present

## 2017-01-08 DIAGNOSIS — E784 Other hyperlipidemia: Secondary | ICD-10-CM | POA: Diagnosis not present

## 2017-01-08 DIAGNOSIS — I10 Essential (primary) hypertension: Secondary | ICD-10-CM | POA: Diagnosis not present

## 2017-01-12 DIAGNOSIS — Z23 Encounter for immunization: Secondary | ICD-10-CM | POA: Diagnosis not present

## 2017-01-12 DIAGNOSIS — I10 Essential (primary) hypertension: Secondary | ICD-10-CM | POA: Diagnosis not present

## 2017-01-12 DIAGNOSIS — E785 Hyperlipidemia, unspecified: Secondary | ICD-10-CM | POA: Diagnosis not present

## 2017-01-12 DIAGNOSIS — Z0000 Encounter for general adult medical examination without abnormal findings: Secondary | ICD-10-CM | POA: Diagnosis not present

## 2017-01-12 DIAGNOSIS — E119 Type 2 diabetes mellitus without complications: Secondary | ICD-10-CM | POA: Diagnosis not present

## 2017-01-25 DIAGNOSIS — N952 Postmenopausal atrophic vaginitis: Secondary | ICD-10-CM | POA: Diagnosis not present

## 2017-03-31 DIAGNOSIS — I10 Essential (primary) hypertension: Secondary | ICD-10-CM | POA: Diagnosis not present

## 2017-03-31 DIAGNOSIS — E7841 Elevated Lipoprotein(a): Secondary | ICD-10-CM | POA: Diagnosis not present

## 2017-03-31 DIAGNOSIS — E1165 Type 2 diabetes mellitus with hyperglycemia: Secondary | ICD-10-CM | POA: Diagnosis not present

## 2017-05-06 DIAGNOSIS — N952 Postmenopausal atrophic vaginitis: Secondary | ICD-10-CM | POA: Diagnosis not present

## 2017-05-06 DIAGNOSIS — R8290 Unspecified abnormal findings in urine: Secondary | ICD-10-CM | POA: Diagnosis not present

## 2017-05-20 DIAGNOSIS — E113293 Type 2 diabetes mellitus with mild nonproliferative diabetic retinopathy without macular edema, bilateral: Secondary | ICD-10-CM | POA: Diagnosis not present

## 2017-05-20 DIAGNOSIS — H2513 Age-related nuclear cataract, bilateral: Secondary | ICD-10-CM | POA: Diagnosis not present

## 2017-08-27 DIAGNOSIS — I10 Essential (primary) hypertension: Secondary | ICD-10-CM | POA: Diagnosis not present

## 2017-08-27 DIAGNOSIS — E1165 Type 2 diabetes mellitus with hyperglycemia: Secondary | ICD-10-CM | POA: Diagnosis not present

## 2017-08-27 DIAGNOSIS — E7841 Elevated Lipoprotein(a): Secondary | ICD-10-CM | POA: Diagnosis not present

## 2017-09-03 ENCOUNTER — Other Ambulatory Visit: Payer: Self-pay | Admitting: Family Medicine

## 2017-09-03 DIAGNOSIS — Z1231 Encounter for screening mammogram for malignant neoplasm of breast: Secondary | ICD-10-CM

## 2017-09-15 ENCOUNTER — Other Ambulatory Visit: Payer: Self-pay | Admitting: Family Medicine

## 2017-09-15 DIAGNOSIS — N644 Mastodynia: Secondary | ICD-10-CM

## 2017-09-24 ENCOUNTER — Ambulatory Visit
Admission: RE | Admit: 2017-09-24 | Discharge: 2017-09-24 | Disposition: A | Discharge status: Home / Self Care | Payer: 59 | Source: Ambulatory Visit | Attending: Family Medicine | Admitting: Family Medicine

## 2017-09-24 DIAGNOSIS — N644 Mastodynia: Secondary | ICD-10-CM | POA: Diagnosis not present

## 2017-09-24 DIAGNOSIS — R928 Other abnormal and inconclusive findings on diagnostic imaging of breast: Secondary | ICD-10-CM | POA: Diagnosis not present

## 2017-11-18 DIAGNOSIS — E113293 Type 2 diabetes mellitus with mild nonproliferative diabetic retinopathy without macular edema, bilateral: Secondary | ICD-10-CM | POA: Diagnosis not present

## 2017-11-18 DIAGNOSIS — H2513 Age-related nuclear cataract, bilateral: Secondary | ICD-10-CM | POA: Diagnosis not present

## 2018-01-14 DIAGNOSIS — E119 Type 2 diabetes mellitus without complications: Secondary | ICD-10-CM | POA: Diagnosis not present

## 2018-01-14 DIAGNOSIS — I10 Essential (primary) hypertension: Secondary | ICD-10-CM | POA: Diagnosis not present

## 2018-01-14 DIAGNOSIS — E785 Hyperlipidemia, unspecified: Secondary | ICD-10-CM | POA: Diagnosis not present

## 2018-01-17 DIAGNOSIS — Z0000 Encounter for general adult medical examination without abnormal findings: Secondary | ICD-10-CM | POA: Diagnosis not present

## 2018-03-04 DIAGNOSIS — E1165 Type 2 diabetes mellitus with hyperglycemia: Secondary | ICD-10-CM | POA: Diagnosis not present

## 2018-03-04 DIAGNOSIS — E7841 Elevated Lipoprotein(a): Secondary | ICD-10-CM | POA: Diagnosis not present

## 2018-03-04 DIAGNOSIS — I10 Essential (primary) hypertension: Secondary | ICD-10-CM | POA: Diagnosis not present

## 2018-03-30 ENCOUNTER — Encounter (HOSPITAL_COMMUNITY): Payer: Self-pay | Admitting: Emergency Medicine

## 2018-03-30 ENCOUNTER — Emergency Department (HOSPITAL_COMMUNITY): Payer: 59

## 2018-03-30 ENCOUNTER — Emergency Department (HOSPITAL_COMMUNITY)
Admission: EM | Admit: 2018-03-30 | Discharge: 2018-03-30 | Disposition: A | Discharge status: Home / Self Care | Payer: 59 | Attending: Emergency Medicine | Admitting: Emergency Medicine

## 2018-03-30 ENCOUNTER — Other Ambulatory Visit: Payer: Self-pay

## 2018-03-30 ENCOUNTER — Ambulatory Visit (INDEPENDENT_AMBULATORY_CARE_PROVIDER_SITE_OTHER)
Admission: EM | Admit: 2018-03-30 | Discharge: 2018-03-30 | Disposition: A | Discharge status: Home / Self Care | Payer: 59 | Source: Home / Self Care

## 2018-03-30 DIAGNOSIS — E119 Type 2 diabetes mellitus without complications: Secondary | ICD-10-CM | POA: Diagnosis not present

## 2018-03-30 DIAGNOSIS — R590 Localized enlarged lymph nodes: Secondary | ICD-10-CM | POA: Diagnosis not present

## 2018-03-30 DIAGNOSIS — R079 Chest pain, unspecified: Secondary | ICD-10-CM

## 2018-03-30 DIAGNOSIS — Z794 Long term (current) use of insulin: Secondary | ICD-10-CM | POA: Diagnosis not present

## 2018-03-30 DIAGNOSIS — R0789 Other chest pain: Secondary | ICD-10-CM | POA: Diagnosis not present

## 2018-03-30 DIAGNOSIS — Z79899 Other long term (current) drug therapy: Secondary | ICD-10-CM | POA: Insufficient documentation

## 2018-03-30 DIAGNOSIS — R0600 Dyspnea, unspecified: Secondary | ICD-10-CM | POA: Diagnosis not present

## 2018-03-30 HISTORY — DX: Type 2 diabetes mellitus without complications: E11.9

## 2018-03-30 LAB — COMPREHENSIVE METABOLIC PANEL
ALT: 26 U/L (ref 0–44)
ANION GAP: 13 (ref 5–15)
AST: 26 U/L (ref 15–41)
Albumin: 4.4 g/dL (ref 3.5–5.0)
Alkaline Phosphatase: 92 U/L (ref 38–126)
BUN: 22 mg/dL (ref 8–23)
CHLORIDE: 100 mmol/L (ref 98–111)
CO2: 25 mmol/L (ref 22–32)
Calcium: 9.7 mg/dL (ref 8.9–10.3)
Creatinine, Ser: 0.94 mg/dL (ref 0.44–1.00)
GFR calc Af Amer: >60 mL/min (ref >60)
Glucose, Bld: 111 mg/dL — ABNORMAL HIGH (ref 70–99)
POTASSIUM: 3.7 mmol/L (ref 3.5–5.1)
Sodium: 138 mmol/L (ref 135–145)
Total Bilirubin: 0.5 mg/dL (ref 0.3–1.2)
Total Protein: 8.2 g/dL — ABNORMAL HIGH (ref 6.5–8.1)

## 2018-03-30 LAB — I-STAT CHEM 8, ED
BUN: 24 mg/dL — ABNORMAL HIGH (ref 8–23)
Calcium, Ion: 1.08 mmol/L — ABNORMAL LOW (ref 1.15–1.40)
Chloride: 105 mmol/L (ref 98–111)
Creatinine, Ser: 0.9 mg/dL (ref 0.44–1.00)
Glucose, Bld: 107 mg/dL — ABNORMAL HIGH (ref 70–99)
HEMATOCRIT: 46 % (ref 36.0–46.0)
HEMOGLOBIN: 15.6 g/dL — AB (ref 12.0–15.0)
POTASSIUM: 3.7 mmol/L (ref 3.5–5.1)
SODIUM: 138 mmol/L (ref 135–145)
TCO2: 26 mmol/L (ref 22–32)

## 2018-03-30 LAB — CBC
HEMATOCRIT: 47.2 % — AB (ref 36.0–46.0)
HEMOGLOBIN: 14.2 g/dL (ref 12.0–15.0)
MCH: 25.5 pg — ABNORMAL LOW (ref 26.0–34.0)
MCHC: 30.1 g/dL (ref 30.0–36.0)
MCV: 84.9 fL (ref 80.0–100.0)
NRBC: 0 % (ref 0.0–0.2)
Platelets: 313 10*3/uL (ref 150–400)
RBC: 5.56 MIL/uL — AB (ref 3.87–5.11)
RDW: 13.2 % (ref 11.5–15.5)
WBC: 8.1 10*3/uL (ref 4.0–10.5)

## 2018-03-30 LAB — D-DIMER, QUANTITATIVE (NOT AT ARMC): D DIMER QUANT: 0.56 ug{FEU}/mL — AB (ref 0.00–0.50)

## 2018-03-30 LAB — I-STAT TROPONIN, ED
TROPONIN I, POC: 0 ng/mL (ref 0.00–0.08)
Troponin i, poc: 0 ng/mL (ref 0.00–0.08)

## 2018-03-30 LAB — LIPASE, BLOOD: Lipase: 48 U/L (ref 11–51)

## 2018-03-30 MED ORDER — IOPAMIDOL (ISOVUE-370) INJECTION 76%
INTRAVENOUS | Status: AC
  Administered 2018-03-30: 60 mL
  Filled 2018-03-30: qty 100

## 2018-03-30 MED ORDER — ASPIRIN 81 MG PO CHEW
324.0000 mg | CHEWABLE_TABLET | Freq: Once | ORAL | Status: AC
  Administered 2018-03-30: 324 mg via ORAL
  Filled 2018-03-30: qty 4

## 2018-03-30 NOTE — Discharge Instructions (Addendum)

## 2018-03-30 NOTE — ED Triage Notes (Signed)
Pt c/o L sided rib pain, nontender to touch, states lying down makes it worse.

## 2018-03-30 NOTE — Discharge Instructions (Signed)
Please go to emergency room for further evaluation.

## 2018-03-30 NOTE — ED Triage Notes (Signed)
Per Pt she was at worked and walked down to the mail room and developed left chest pain 7/10 and SOB.  NAD noted at this time. AOx4

## 2018-03-30 NOTE — ED Notes (Signed)
Patient transported to CT 

## 2018-03-30 NOTE — ED Provider Notes (Signed)
Stacy Dickerson is a [age] year old female, medical history notable for DM, presenting today for evaluation of chest pain.  Patient states that she has had left-sided pain to her left lower breast extending to side/axillary area.  This is been nontender to touch.  She noticed this last night while lying in bed, woke up this morning and symptoms came on again while going to work.  Patient was triaged, EKG obtained showing nonspecific T wave inversions in leads V1 through V3.  No previous EKG to compare.  Given this and history of diabetes, recommended further work-up in emergency room in order to rule out cardiac causes of discomfort.  Patient was stable on discharge and transported via staff in wheelchair.   Lew DawesWieters, Hallie C, PA-C 03/30/18 1054

## 2018-03-30 NOTE — ED Notes (Signed)
ekg given to Mattelhallie PA

## 2018-03-30 NOTE — ED Provider Notes (Addendum)
[facility] EMERGENCY DEPARTMENT Provider Note   CSN: [id number] Arrival date & time: 03/30/18  1038     History   Chief Complaint Chief Complaint  Patient presents with  . Chest Pain    HPI Stacy Dickerson is a 62 y.o. female  The history is provided by the patient and medical records. No language interpreter was used.  Chest Pain   This is a new problem. The current episode started 6 to 12 hours ago. The problem occurs constantly. The problem has not changed since onset.The pain is associated with exertion. The pain is present in the lateral region (left lateral chest). The pain is moderate. The quality of the pain is described as pressure-like.    Past Medical History:  Diagnosis Date  . Diabetes mellitus without complication (HCC)     There are no active problems to display for this patient.   Past Surgical History:  Procedure Laterality Date  . BREAST BIOPSY Left 2015   US guided     OB History   None      Home Medications    Prior to Admission medications   Medication Sig Start Date End Date Taking? Authorizing Provider  ACCU-CHEK SMARTVIEW test strip  09/11/14   [provider]  furosemide (LASIX) 40 MG tablet  10/29/14   [provider]  INVOKAMET 150-500 MG TABS  09/11/14   [provider]  losartan (COZAAR) 100 MG tablet  10/29/14   [provider]  NOVOLOG MIX 70/30 FLEXPEN (70-30) 100 UNIT/ML FlexPen  12/06/14   [provider]  potassium chloride (K-DUR) 10 MEQ tablet  10/29/14   [provider]  simvastatin (ZOCOR) 20 MG tablet  10/29/14   [provider]    Family History Family History  Problem Relation Age of Onset  . Breast cancer Mother 22    Social History Social History   Tobacco Use  . Smoking status: Never Smoker  Substance Use Topics  . Alcohol use: Not on file  . Drug use: Not on file     Allergies   Ciprofloxacin; Keflex [cephalexin]; Lisinopril; and  Sulfa antibiotics   Review of Systems Review of Systems  Cardiovascular: Positive for chest pain.  Ten systems reviewed and are negative for acute change, except as noted in the HPI.     Physical Exam Updated Vital Signs Ht 5\' 7"  (1.702 m)   Wt 121.6 kg   BMI 41.97 kg/m   Physical Exam Vitals signs and nursing note reviewed.  Constitutional:      General: She is not in acute distress.    Appearance: She is well-developed. She is not diaphoretic.  HENT:     Head: Normocephalic and atraumatic.  Eyes:     General: No scleral icterus.    Conjunctiva/sclera: Conjunctivae normal.  Neck:     Musculoskeletal: Normal range of motion.  Cardiovascular:     Rate and Rhythm: Normal rate and regular rhythm.     Heart sounds: Normal heart sounds. No murmur. No friction rub. No gallop.   Pulmonary:     Effort: Pulmonary effort is normal. No respiratory distress.     Breath sounds: Normal breath sounds.  Abdominal:     General: Bowel sounds are normal. There is no distension.     Palpations: Abdomen is soft. There is no mass.     Tenderness: There is no abdominal tenderness. There is no guarding.  Skin:    General: Skin is warm  and dry.  Neurological:     Mental Status: She is alert and oriented to person, place, and time.  Psychiatric:        Behavior: Behavior normal.      ED Treatments / Results  Labs (all labs ordered are listed, but only abnormal results are displayed) Labs Reviewed  CBC  COMPREHENSIVE METABOLIC PANEL  LIPASE, BLOOD  D-DIMER, QUANTITATIVE (NOT [facility])  I-STAT TROPONIN, ED  I-STAT CHEM 8, ED    EKG EKG Interpretation  Date/Time:  Wednesday March 30 2018 10:48:12 EST Ventricular Rate:  78 PR Interval:  144 QRS Duration: 78 QT Interval:  388 QTC Calculation: 442 R Axis:   37 Text Interpretation:  Normal sinus rhythm Nonspecific T wave abnormality Abnormal ECG Confirmed by Geoffery LyonseLo, Douglas ([id number]) on 03/30/2018 11:02:35 AM   Radiology No  results found.  Procedures Procedures (including critical care time)  Medications Ordered in ED Medications  aspirin chewable tablet 324 mg (has no administration in time range)     Initial Impression / Assessment and Plan / ED Course  I have reviewed the triage vital signs and the nursing notes.  Pertinent labs & imaging results that were available during my care of the patient were reviewed by me and considered in my medical decision making (see chart for details).  Clinical Course as of Mar 30 1542  Wed Mar 30, 2018  1428 Troponin i, poc: 0.00 [AH]    Clinical Course User Index [AH] Arthor CaptainHarris, Melaina Howerton, PA-C    62 year old female presents the emergency department with chief complaint of left-sided chest pain shortness of breath. The emergent differential diagnosis of chest pain includes: Acute coronary syndrome, pericarditis, aortic dissection, pulmonary embolism, tension pneumothorax, pneumonia, and esophageal rupture. Patient has 2- troponins here.  Her EKG does not show any arrhythmias and has nonspecific T wave inversions.  The patient has a negative CT angiogram after elevated d-dimer.  Her CT angios does show some left axillary and adenopathy.  Patient states that she had seen this about 2 years ago in the past with mammography and had a lymph node biopsy that was negative.  She states that during her mammogram this past year no one mentioned that she still had the adenopathy and she will follow-up with her PCP regarding comparative studies.  The patient's chest pain has improved.  She denies any active shortness of breath.  She is hemodynamically stable.  The patient appears appropriate for discharge at this time.  Final Clinical Impressions(s) / ED Diagnoses   Final diagnoses:  None    ED Discharge Orders    None       Arthor CaptainHarris, Ferdinando Lodge, PA-C 03/30/18 1545    Geoffery Lyonselo, Douglas, MD 03/31/18 1537    Arthor CaptainHarris, Frankye Schwegel, PA-C 06/03/18 [id number]    Geoffery Lyonselo, Douglas, MD 06/04/18  [phone]

## 2018-04-05 DIAGNOSIS — M1990 Unspecified osteoarthritis, unspecified site: Secondary | ICD-10-CM | POA: Diagnosis not present

## 2018-04-05 DIAGNOSIS — R591 Generalized enlarged lymph nodes: Secondary | ICD-10-CM | POA: Diagnosis not present

## 2018-04-05 DIAGNOSIS — M940 Chondrocostal junction syndrome [Tietze]: Secondary | ICD-10-CM | POA: Diagnosis not present

## 2018-04-08 ENCOUNTER — Other Ambulatory Visit: Payer: Self-pay | Admitting: Family Medicine

## 2018-04-08 DIAGNOSIS — R2232 Localized swelling, mass and lump, left upper limb: Secondary | ICD-10-CM

## 2018-04-08 DIAGNOSIS — R591 Generalized enlarged lymph nodes: Secondary | ICD-10-CM

## 2018-04-13 ENCOUNTER — Other Ambulatory Visit: Payer: 59

## 2018-04-15 ENCOUNTER — Ambulatory Visit
Admission: RE | Admit: 2018-04-15 | Discharge: 2018-04-15 | Disposition: A | Discharge status: Home / Self Care | Payer: 59 | Source: Ambulatory Visit | Attending: Family Medicine | Admitting: Family Medicine

## 2018-04-15 DIAGNOSIS — R2232 Localized swelling, mass and lump, left upper limb: Secondary | ICD-10-CM

## 2018-04-15 DIAGNOSIS — R928 Other abnormal and inconclusive findings on diagnostic imaging of breast: Secondary | ICD-10-CM | POA: Diagnosis not present

## 2018-04-15 DIAGNOSIS — N6489 Other specified disorders of breast: Secondary | ICD-10-CM | POA: Diagnosis not present

## 2018-05-23 DIAGNOSIS — E113293 Type 2 diabetes mellitus with mild nonproliferative diabetic retinopathy without macular edema, bilateral: Secondary | ICD-10-CM | POA: Diagnosis not present

## 2018-05-23 DIAGNOSIS — H2513 Age-related nuclear cataract, bilateral: Secondary | ICD-10-CM | POA: Diagnosis not present

## 2018-09-30 ENCOUNTER — Other Ambulatory Visit: Payer: Self-pay | Admitting: Family Medicine

## 2018-09-30 DIAGNOSIS — Z1231 Encounter for screening mammogram for malignant neoplasm of breast: Secondary | ICD-10-CM

## 2018-11-16 ENCOUNTER — Other Ambulatory Visit: Payer: Self-pay

## 2018-11-16 ENCOUNTER — Ambulatory Visit
Admission: RE | Admit: 2018-11-16 | Discharge: 2018-11-16 | Disposition: A | Discharge status: Home / Self Care | Payer: 59 | Source: Ambulatory Visit | Attending: Family Medicine | Admitting: Family Medicine

## 2018-11-16 DIAGNOSIS — Z1231 Encounter for screening mammogram for malignant neoplasm of breast: Secondary | ICD-10-CM

## 2019-08-25 ENCOUNTER — Other Ambulatory Visit: Payer: Self-pay | Admitting: Family Medicine

## 2019-08-25 ENCOUNTER — Ambulatory Visit
Admission: RE | Admit: 2019-08-25 | Discharge: 2019-08-25 | Disposition: A | Discharge status: Home / Self Care | Payer: 59 | Source: Ambulatory Visit | Attending: Family Medicine | Admitting: Family Medicine

## 2019-08-25 DIAGNOSIS — R05 Cough: Secondary | ICD-10-CM

## 2019-08-25 DIAGNOSIS — R059 Cough, unspecified: Secondary | ICD-10-CM

## 2019-10-06 ENCOUNTER — Other Ambulatory Visit: Payer: Self-pay | Admitting: Family Medicine

## 2019-10-06 DIAGNOSIS — Z1231 Encounter for screening mammogram for malignant neoplasm of breast: Secondary | ICD-10-CM

## 2019-11-17 ENCOUNTER — Ambulatory Visit
Admission: RE | Admit: 2019-11-17 | Discharge: 2019-11-17 | Disposition: A | Discharge status: Home / Self Care | Payer: 59 | Source: Ambulatory Visit | Attending: Family Medicine | Admitting: Family Medicine

## 2019-11-17 ENCOUNTER — Other Ambulatory Visit: Payer: Self-pay

## 2019-11-17 DIAGNOSIS — Z1231 Encounter for screening mammogram for malignant neoplasm of breast: Secondary | ICD-10-CM

## 2020-03-02 IMAGING — DX DG CHEST 1V PORT
1 series · 1 of 1 positions shown · non-contrast
Comparison: 08/13/2014 chest radiograph.

CLINICAL DATA: Chest pain and dyspnea

EXAM:
PORTABLE CHEST 1 VIEW

[chest ap]
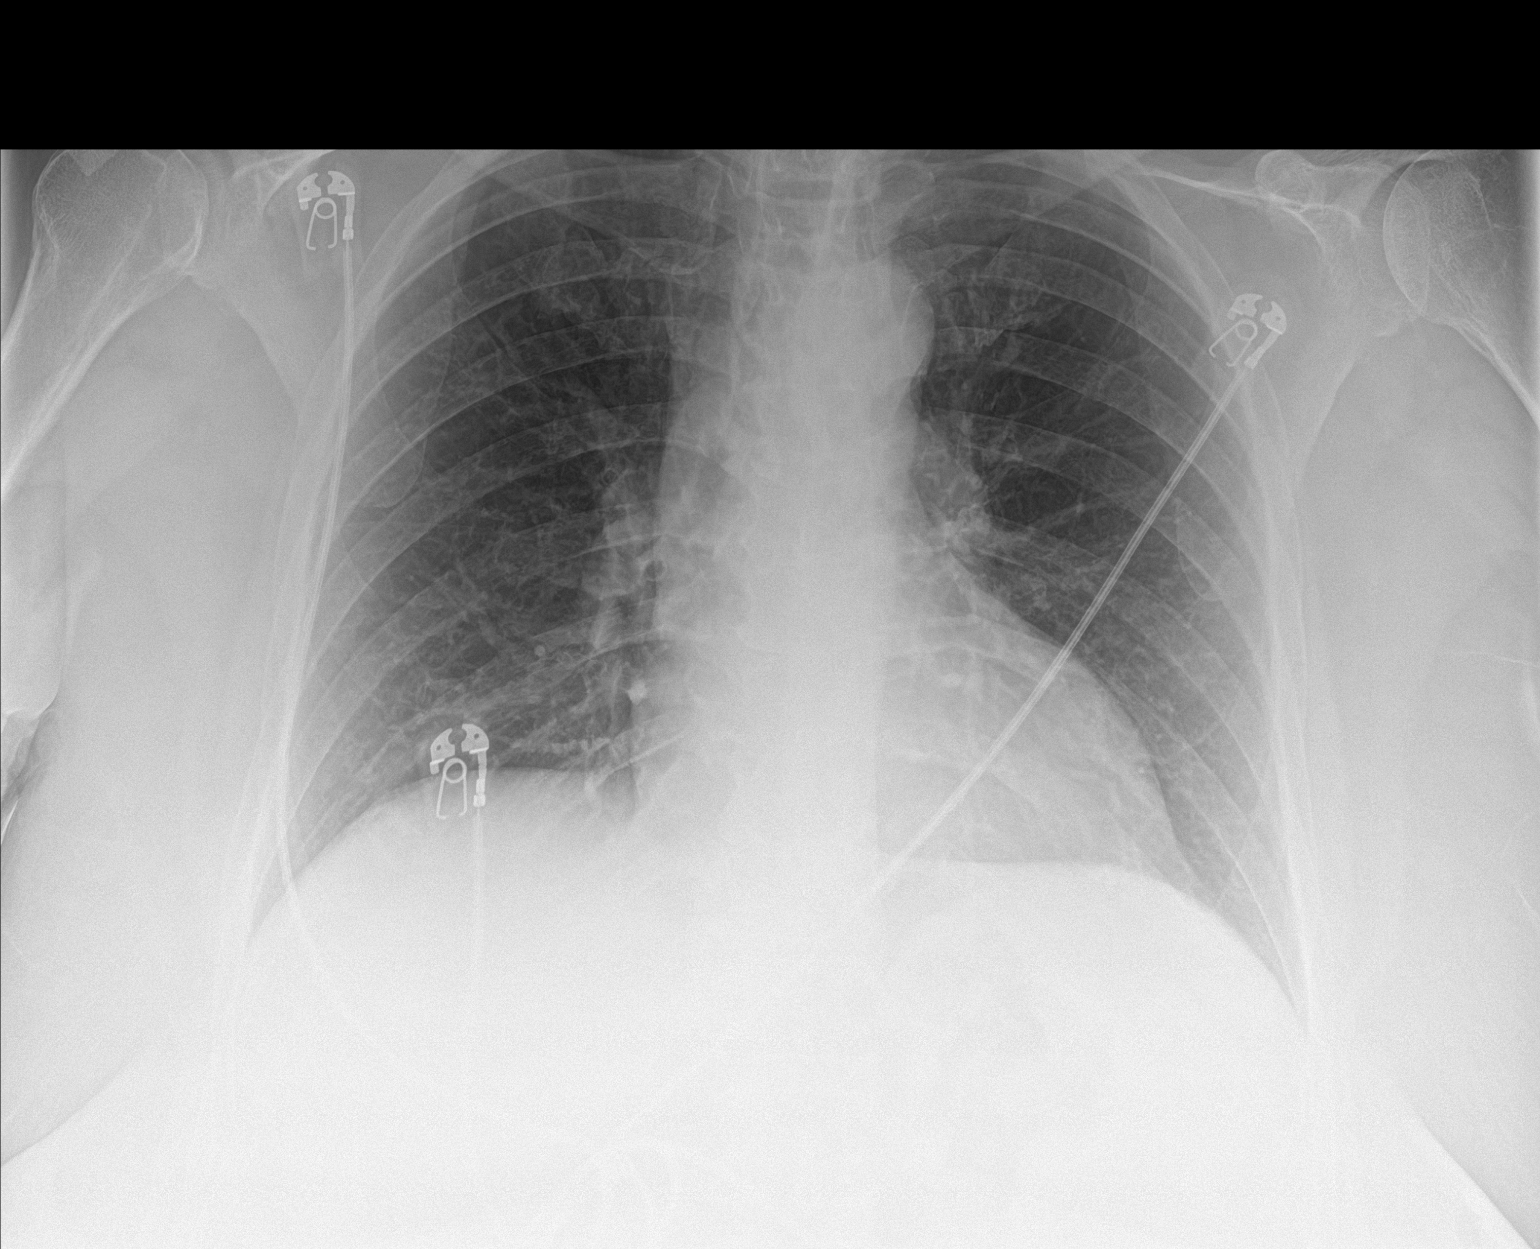

[1 of 1 positions shown; findings below may reference images not displayed]

FINDINGS: Stable cardiomediastinal silhouette with normal heart size. No
pneumothorax. No pleural effusion. Lungs appear clear, with no acute
consolidative airspace disease and no pulmonary edema.
IMPRESSION: No active disease.

## 2020-04-25 ENCOUNTER — Encounter: Payer: Self-pay | Admitting: Neurology

## 2020-04-25 ENCOUNTER — Other Ambulatory Visit: Payer: Self-pay | Admitting: Neurology

## 2020-04-29 ENCOUNTER — Encounter: Payer: Self-pay | Admitting: Neurology

## 2020-04-29 ENCOUNTER — Ambulatory Visit: Payer: 59 | Admitting: Neurology

## 2020-04-29 VITALS — BP 128/80 | HR 98 | Ht 67.0 in | Wt 275.0 lb

## 2020-04-29 DIAGNOSIS — I951 Orthostatic hypotension: Secondary | ICD-10-CM | POA: Diagnosis not present

## 2020-04-29 DIAGNOSIS — H81399 Other peripheral vertigo, unspecified ear: Secondary | ICD-10-CM

## 2020-04-29 DIAGNOSIS — R42 Dizziness and giddiness: Secondary | ICD-10-CM | POA: Diagnosis not present

## 2020-04-29 DIAGNOSIS — G4733 Obstructive sleep apnea (adult) (pediatric): Secondary | ICD-10-CM

## 2020-04-29 NOTE — Patient Instructions (Addendum)
It was nice to meet you today.  I do believe you have paroxysmal vertigo which is episodic vertigo, these episodes can last several days.  I doubt that you have vestibular migraines, as you really have not had any migraines.  I would recommend vestibular rehab with physical therapy to improve your vertigo and help you with exercises for future reference as your vertigo may return.   Please remember, that vertigo can recur without warning, triggers could be stress, sleep deprivation, dehydration and even taking a bumpy car ride, sometimes an acute illness, even a common (viral) cold. It can last hours or days. Please change positions slowly and always stay well-hydrated. Physical therapy with particular attention to vestibular rehabilitation can be very helpful. While there is no specific medication that helps with vertigo, some people get relief with as needed use of meclizine. Certain medications can exacerbate vertigo.   As discussed, would like to proceed with a brain MRI with and without contrast to make sure there is no structural cause of your symptoms.  In addition, I would like for you to have a sleep study to reevaluate your sleep apnea diagnosis and treatment with a CPAP or AutoPap machine may improve your symptoms.  Please talk to your primary care physician about your blood pressure fluctuation, in particular, you had a significant blood pressure drop in the systolic which is the top number today of 50 points.  This can cause lightheadedness and be a contributor to your symptoms of dizziness.  We will keep you posted as to your test results by phone call and plan to follow-up after testing.

## 2020-04-29 NOTE — Progress Notes (Signed)
Subjective:    Patient ID: Stacy Dickerson is a 65 y.o. female.  HPI     Huston Foley, MD, PhD [facility] [address], [address] [address] [place], [place] [address]  Dear Stacy Dickerson,   I saw your patient, Stacy Dickerson, upon your kind request in my neurologic clinic today for initial consultation of her dizziness, concern for vestibular migraines.  The patient is unaccompanied today.  As you know, Ms. Vannatter is a 65 year old right-handed woman with an underlying medical history of hyperlipidemia, diabetes, hypertension, arthritis, OSA (no longer on CPAP), lupus, and severe obesity with a BMI of over 40, who reports episodic vertigo for the past several years.  She had one episode several years ago and 2 episodes last year, lasting about a week at a time.  She currently feels that she has been improving.  She admits that a trigger could have been an increase salt intake recently, she had discovered salt and vinegar chips and was indulging possibly too much but has not had any in the past several weeks.  She tries to hydrate well.  She has not fallen thankfully.  She does not have any history of migraines growing up or as a young adult.  She did have some photophobia after she was exposed to bright light recently.  She had been in the house, trying to move as little as possible and when she stepped out to get the mail which had backed up, she felt photophobic.  Her vertigo is positional, she noticed it primarily when changing position and better when trying to get out of bed.  She reports a spinning sensation associated with nausea.  She had trouble walking a straight line.  She is on the mend.  She has not had any sudden onset of one-sided weakness or numbness or tingling or droopy face or slurring of speech or vision loss.  She reports a prior diagnosis of lupus.  She is followed infrequently by rheumatology but has not seen her rheumatologist in some years.  She is followed by Dr. Talmage Nap  for her diabetes and latest A1c was below 7.0.  She had blood work through her primary care physician, Dr. Hyman Hopes.  She was able to pull up her results on her phone on the portal.  Her blood sugar level from 04/11/2020 was elevated at 169, BUN was 15, creatinine 0.92.  On 03/25/2020 her TSH was 2.96.  Her A1c in July was 7.1 but has since then improved per patient.  She had a sleep study approximately 8 years ago.  She recalls being told that she had borderline sleep apnea.  She had a CPAP machine but stopped using it several years ago.  It was cumbersome to use and left marks on her face.  She would be willing to get rechecked for sleep apnea.  She had some weight fluctuation over time.  Altogether, she believes she had 3 distinct vertigo attacks in the past years.  She also reports difficulty with arthritis, in particular, her right knee bothers her.  She is single, she lives with her 21 year old mother who has lung cancer and Alzheimer's dementia.  The patient has 1 grown daughter, age 23.  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  She is currently working from home.  She rarely drinks any alcohol, she drinks caffeine in the form of tea. I reviewed your office note from 02/02/2020.  She had a hearing test  as well on 02/02/2020.  She was noted to have mild to moderate sensorineural hearing loss in the left ear.  Her Past Medical History Is Significant For: Past Medical History:  Diagnosis Date  . Diabetes mellitus without complication (HCC)     Her Past Surgical History Is Significant For: Past Surgical History:  Procedure Laterality Date  . BREAST BIOPSY Left 2015   US guided    Her Family History Is Significant For: Family History  Problem Relation Age of Onset  . Breast cancer Mother 43    Her Social History Is Significant For: Social History   Socioeconomic History  . Marital status: Single    Spouse name: Not on file  . Number of children:  Not on file  . Years of education: Not on file  . Highest education level: Not on file  Occupational History  . Not on file  Tobacco Use  . Smoking status: Never Smoker  . Smokeless tobacco: Never Used  Substance and Sexual Activity  . Alcohol use: Yes    Alcohol/week: 1.0 standard drink    Types: 1 Standard drinks or equivalent per week  . Drug use: Never  . Sexual activity: Not on file  Other Topics Concern  . Not on file  Social History Narrative  . Not on file   Social Determinants of Health   Financial Resource Strain: Not on file  Food Insecurity: Not on file  Transportation Needs: Not on file  Physical Activity: Not on file  Stress: Not on file  Social Connections: Not on file    Her Allergies Are:  Allergies  Allergen Reactions  . Ciprofloxacin Itching    Skin gets red  . Keflex [Cephalexin] Itching    Skin gets red  . Lisinopril Cough    Pt stated, "gets a violent cough"  . Sulfa Antibiotics Itching  :   Her Current Medications Are:  Outpatient Encounter Medications as of 04/29/2020  Medication Sig  . citalopram (CELEXA) 20 MG tablet Take 20 mg by mouth daily.  . Dulaglutide (TRULICITY) 1.5 MG/0.5ML SOPN Inject into the skin.  . Empagliflozin-metFORMIN HCl (SYNJARDY) 12.08-998 MG TABS Take by mouth.  . furosemide (LASIX) 40 MG tablet Take 40 mg by mouth daily.   Marland Kitchen glimepiride (AMARYL) 2 MG tablet Take 2 mg by mouth 2 (two) times daily.  Marland Kitchen losartan (COZAAR) 100 MG tablet Take 100 mg by mouth every morning.   . meclizine (ANTIVERT) 25 MG tablet TAKE 1 TABLET BY MOUTH THREE TIMES DAILY FOR 10 DAYS AS NEEDED FOR VERTIGO  . meloxicam (MOBIC) 15 MG tablet Take by mouth.  . ondansetron (ZOFRAN) 8 MG tablet TAKE 1 TABLET BY MOUTH THREE TIMES DAILY FOR 5 DAYS AS NEEDED FOR NAUSEA  . potassium chloride (K-DUR) 10 MEQ tablet Take 10 mEq by mouth every morning.   . simvastatin (ZOCOR) 20 MG tablet Take 20 mg by mouth at bedtime.   . TRULICITY 0.75 MG/0.5ML SOPN  Inject 0.75 mg into the skin once a week.   . [DISCONTINUED] INVOKAMET 150-500 MG TABS Take 1 tablet by mouth 2 (two) times daily.    No facility-administered encounter medications on file as of 04/29/2020.  :   Review of Systems:  Out of a complete 14 point review of systems, all are reviewed and negative with the exception of these symptoms as listed below:  Review of Systems  Neurological:       Pt presents today to discuss possible vestibular migraines. Pt reports  occasional vertigo. Pt has been seen by ENT. Pt has had a sleep study in the past and placed on cpap but she is not using it. Pt is unsure if she snores.  Epworth Sleepiness Scale 0= would never doze 1= slight chance of dozing 2= moderate chance of dozing 3= high chance of dozing  Sitting and reading: 2 Watching TV: 3 Sitting inactive in a public place (ex. Theater or meeting): 0 As a passenger in a car for an hour without a break: 3 Lying down to rest in the afternoon: 3 Sitting and talking to someone: 0 Sitting quietly after lunch (no alcohol): 2 In a car, while stopped in traffic: 0 Total: 13     Objective:  Neurological Exam  Physical Exam Physical Examination:   Vitals:   04/29/20 0929  BP: 128/80  Pulse: 98    General Examination: The patient is a very pleasant 65 y.o. female in no acute distress. She appears well-developed and well-nourished and well groomed.   Of note, she has mild and brief lightheadedness upon standing.  Orthostatic testing shows a low range blood pressure and pulse of 147/88 with a pulse of 104, sitting 128/80 with a pulse of 98, standing 97/69 with a pulse of 105.  HEENT: Normocephalic, atraumatic, pupils are equal, round and reactive to light and accommodation.  She has corrective eyeglasses.  Funduscopic exam is normal with sharp disc margins noted. Extraocular tracking is good without limitation to gaze excursion or nystagmus noted. Normal smooth pursuit is noted.  She denies  any vertiginous symptoms currently, in particular, she has no nystagmus or vertiginous symptoms upon sudden changes of head position.  Hearing is grossly intact. Face is symmetric with normal facial animation and normal facial sensation. Speech is clear with no dysarthria noted. There is no hypophonia. There is no lip, neck/head, jaw or voice tremor. Neck is supple with full range of passive and active motion. There are no carotid bruits on auscultation. Oropharynx exam reveals: mild mouth dryness, adequate dental hygiene and mild airway crowding, due to small airway entry.  Tonsils absent.  Mallampati class II.  Tongue protrudes centrally in palate elevates symmetrically.  Chest: Clear to auscultation without wheezing, rhonchi or crackles noted.  Heart: S1+S2+0, regular and normal without murmurs, rubs or gallops noted.   Abdomen: Soft, non-tender and non-distended with normal bowel sounds appreciated on auscultation.  Extremities: There is no pitting edema in the distal lower extremities bilaterally. Pedal pulses are intact.  Skin: Warm and dry without trophic changes noted.  Musculoskeletal: exam reveals no obvious joint deformities, tenderness or joint swelling or erythema.   Neurologically:  Mental status: The patient is awake, alert and oriented in all 4 spheres. Her immediate and remote memory, attention, language skills and fund of knowledge are appropriate. There is no evidence of aphasia, agnosia, apraxia or anomia. Speech is clear with normal prosody and enunciation. Thought process is linear. Mood is normal and affect is normal.  Cranial nerves II - XII are as described above under HEENT exam. In addition: shoulder shrug is normal with equal shoulder height noted. Motor exam: Normal bulk, strength and tone is noted. There is no drift, tremor or rebound. Reflexes are 1+ throughout. Babinski: Toes are flexor bilaterally. Fine motor skills and coordination: intact with normal finger taps,  normal hand movements, normal rapid alternating patting, normal foot taps and normal foot agility.  Cerebellar testing: No dysmetria or intention tremor on finger to nose testing. Heel to shin is unremarkable  bilaterally. There is no truncal or gait ataxia.  Sensory exam: intact to light touch in the upper and lower extremities.  Gait, station and balance: She stands easily. No veering to one side is noted. No leaning to one side is noted. Posture is age-appropriate and stance is narrow based. Gait shows normal stride length and normal pace. No problems turning are noted.                Assessment and Plan:   In summary, Pebbles Zeiders Amescua is a very pleasant 65 y.o.-year old female with an underlying medical history of hyperlipidemia, diabetes, hypertension, arthritis, OSA (no longer on CPAP), lupus, and severe obesity with a BMI of over 40, who presents for evaluation of her vertigo.  Her history is in keeping with paroxysmal vertigo, likely peripheral in nature.  She has no history of migraines, her history is not supportive of vestibular migraines.  She does have a history of sleep apnea which is currently untreated.  She tried CPAP therapy in the past and would be willing to get rechecked.  I explained to her that her vertigo triggers could include poor sleep or sleep deprivation.  She has not had a brain scan as far she recalls and has not had any vestibular rehab.  Of note, she tried meclizine which made her sleepy.  She is advised to proceed with a brain MRI with and without contrast to rule out a structural cause of her vertigo.  In addition, she is agreeable to pursuing a sleep study to evaluate her sleep apnea again.  If she has obstructive sleep apnea, I advised her that she would likely benefit from CPAP or AutoPap therapy.  She is agreeable to pursuing this if need be.  We will call her with her brain MRI results and sleep study results and proceed from there and plan to follow-up after testing.   Neurologically, her exam is benign with the exception of orthostatic hypotension with mild symptoms of lightheadedness upon standing.  She is advised to follow-up with her primary care physician regarding blood pressure management.  She did have a 50 point drop in the systolic value between lying and standing.  She is advised to lay low with salty snacks and continue to stay well-hydrated, change positions slowly.  I have made a referral to physical therapy at neuro rehab for vestibular rehabilitation and therapeutic exercises that she could potentially continue at home.  She is agreeable to this as well.   Of note, she typically has an eye examination twice a year.  She is up-to-date with her eye exam.   Thank you very much for allowing me to participate in the care of this nice patient. If I can be of any further assistance to you please do not hesitate to call me at [phone].  Sincerely,   Star Age, MD, PhD

## 2020-04-30 ENCOUNTER — Telehealth: Payer: Self-pay | Admitting: Neurology

## 2020-04-30 NOTE — Telephone Encounter (Signed)
[employer] auth: NPR via [employer] website order faxed to [facility] they will reach out to the patient to schedule.

## 2020-05-03 ENCOUNTER — Other Ambulatory Visit: Payer: Self-pay

## 2020-05-03 ENCOUNTER — Ambulatory Visit: Payer: 59 | Admitting: Internal Medicine

## 2020-05-03 ENCOUNTER — Encounter: Payer: Self-pay | Admitting: Internal Medicine

## 2020-05-03 ENCOUNTER — Telehealth: Payer: Self-pay

## 2020-05-03 VITALS — BP 120/74 | HR 74 | Ht 67.0 in | Wt 276.0 lb

## 2020-05-03 DIAGNOSIS — R0600 Dyspnea, unspecified: Secondary | ICD-10-CM

## 2020-05-03 DIAGNOSIS — E1169 Type 2 diabetes mellitus with other specified complication: Secondary | ICD-10-CM

## 2020-05-03 DIAGNOSIS — R079 Chest pain, unspecified: Secondary | ICD-10-CM | POA: Diagnosis not present

## 2020-05-03 DIAGNOSIS — E1159 Type 2 diabetes mellitus with other circulatory complications: Secondary | ICD-10-CM

## 2020-05-03 DIAGNOSIS — I152 Hypertension secondary to endocrine disorders: Secondary | ICD-10-CM

## 2020-05-03 DIAGNOSIS — E119 Type 2 diabetes mellitus without complications: Secondary | ICD-10-CM

## 2020-05-03 DIAGNOSIS — E782 Mixed hyperlipidemia: Secondary | ICD-10-CM

## 2020-05-03 DIAGNOSIS — M329 Systemic lupus erythematosus, unspecified: Secondary | ICD-10-CM | POA: Diagnosis not present

## 2020-05-03 DIAGNOSIS — R0609 Other forms of dyspnea: Secondary | ICD-10-CM

## 2020-05-03 DIAGNOSIS — E669 Obesity, unspecified: Secondary | ICD-10-CM | POA: Insufficient documentation

## 2020-05-03 MED ORDER — METOPROLOL TARTRATE 50 MG PO TABS: ORAL_TABLET | ORAL | 0 refills | Status: DC

## 2020-05-03 NOTE — Telephone Encounter (Signed)
I called pt. I advised her that the MRI showed normal for age findings. Pt verbalized understanding of results.  Pt would like this report to be mailed to her. The report is from [place]. I will ask Stanton Kidney to discuss this with the pt.

## 2020-05-03 NOTE — Telephone Encounter (Signed)
I received patient's brain MRI report.  She had a brain MRI through [facility] on 05/02/2020: Impression: Brain is within normal limits for age.  Please call patient and advise her that her brain MRI was reported as showing normal for age findings, which is reassuring.

## 2020-05-03 NOTE — Telephone Encounter (Signed)
Received MRI results from [facility], study date was 05/02/2020.

## 2020-05-03 NOTE — Patient Instructions (Signed)
Medication Instructions:  Your physician recommends that you continue on your current medications as directed. Please refer to the Current Medication list given to you today.  *If you need a refill on your cardiac medications before your next appointment, please call your pharmacy*   Lab Work: Lab work to be done today--BMP If you have labs (blood work) drawn today and your tests are completely normal, you will receive your results only by: Marland Kitchen MyChart Message (if you have MyChart) OR . A paper copy in the mail If you have any lab test that is abnormal or we need to change your treatment, we will call you to review the results.   Testing/Procedures: Your physician has requested that you have an echocardiogram. Echocardiography is a painless test that uses sound waves to create images of your heart. It provides your doctor with information about the size and shape of your heart and how well your heart's chambers and valves are working. This procedure takes approximately one hour. There are no restrictions for this procedure.  Your physician has requested that you have cardiac CT. Cardiac computed tomography (CT) is a painless test that uses an x-ray machine to take clear, detailed pictures of your heart. For further information please visit HugeFiesta.tn. Please follow instruction sheet as given.      Follow-Up: At [facility], you and your health needs are our priority.  As part of our continuing mission to provide you with exceptional heart care, we have created designated Provider Care Teams.  These Care Teams include your primary Cardiologist (physician) and Advanced Practice Providers (APPs -  Physician Assistants and Nurse Practitioners) who all work together to provide you with the care you need, when you need it.  We recommend signing up for the patient portal called "MyChart".  Sign up information is provided on this After Visit Summary.  MyChart is used to connect with  patients for Virtual Visits (Telemedicine).  Patients are able to view lab/test results, encounter notes, upcoming appointments, etc.  Non-urgent messages can be sent to your provider as well.   To learn more about what you can do with MyChart, go to [email or web address].    Your next appointment:   2-3 month(s)  The format for your next appointment:   In Person  Provider:   You may see Dr Gasper Sells  or one of the following Advanced Practice Providers on your designated Care Team:    Melina Copa, PA-C  Ermalinda Barrios, PA-C    Other Instructions  Your cardiac CT will be scheduled at one of the below locations:   [facility] [address] [place], [place] [address] [phone]  [facility] [address] [place], [place] [address] [phone]  If scheduled at [facility], please arrive at the [facility] main entrance of [facility] 30 minutes prior to test start time. Proceed to the [facility] Radiology Department (first floor) to check-in and test prep.  If scheduled at [facility], please arrive 15 mins early for check-in and test prep.  Please follow these instructions carefully (unless otherwise directed):  Hold all erectile dysfunction medications at least 3 days (72 hrs) prior to test.  On the Night Before the Test: . Be sure to Drink plenty of water. . Do not consume any caffeinated/decaffeinated beverages or chocolate 12 hours prior to your test. . Do not take any antihistamines 12 hours prior to your test.  On the Day of the Test: . Drink plenty of water. Do not drink any water within one hour of the test. . Do not eat any food 4 hours prior to the test. . You may take your regular medications prior to the test.  . Take metoprolol (Lopressor) two hours prior to test. . HOLD Furosemide/Hydrochlorothiazide morning of the test. . FEMALES- please  wear underwire-free bra if available   *     After the Test: . Drink plenty of water. . After receiving IV contrast, you may experience a mild flushed feeling. This is normal. . On occasion, you may experience a mild rash up to 24 hours after the test. This is not dangerous. If this occurs, you can take Benadryl 25 mg and increase your fluid intake. . If you experience trouble breathing, this can be serious. If it is severe call 911 IMMEDIATELY. If it is mild, please call our office. . If you take any of these medications: Glipizide/Metformin, Avandament, Glucavance, please do not take 48 hours after completing test unless otherwise instructed.   Once we have confirmed authorization from your insurance company, we will call you to set up a date and time for your test. Based on how quickly your insurance processes prior authorizations requests, please allow up to 4 weeks to be contacted for scheduling your Cardiac CT appointment. Be advised that routine Cardiac CT appointments could be scheduled as many as 8 weeks after your provider has ordered it.  For non-scheduling related questions, please contact the cardiac imaging nurse navigator should you have any questions/concerns: Marchia Bond, Cardiac Imaging Nurse Navigator [place] Saver, Interim Cardiac Imaging Nurse [place] [facility] Direct Office Dial: [phone]   For scheduling needs, including cancellations and rescheduling, please call [place], [phone].

## 2020-05-03 NOTE — Progress Notes (Signed)
Cardiology Office Note:    Date:  05/03/2020   ID:  Stacy Dickerson, DOB 1956/01/08, MRN [id number]  PCP:  Maurice Small, MD  [facility] Cardiologist:  No primary care provider on file.  [facility] Electrophysiologist:  None   CC: Chest pain Consulted for the evaluation of chest pain at the behest of Maurice Small, MD  History of Present Illness:    Stacy Dickerson is a 65 y.o. female with a hx of Morbid Obesity, DM and HTN, HLD, SLE, OSA formerly on CPAP pending 05/20/20, orthostatic hypotension without presents for evaluation.  Patient notes that she is feeling chest pain.  Patient has had this chest pain in the past, sternal in nature, and was told in the past that it was related to her lupus.  Patient has been having new chest pain starting in December.  This time it was radiating to her axilla.  Now feels like chest pain.  Seen by her PCP with with normal ESR and SED rate.  Sometimes feel it while driving, sometimes in the middle of the night, waiting for her mother waiting for chemotherapy.  This new chest pain was once felt 2 years prior in December and then this past December.  Discomfort occurs with walking up and down the stairs at home, but this is more DOE and fatigue.  Chest pressure improves on its own and with rest.  Patient exertion notable for going up and down the stairs at home.  Minimal shortness of breath at rest.  No PND or orthopnea.  No significant bendopnea, notes intentional weight loss, no leg swelling or abdominal swelling.  No syncope; notes that with getting up quickly will feel near syncope. Notes  no palpitations or funny heart beats.     Patient reports NO prior cardiac testing including  echo,  stress test,  heart catheterizations,  cardioversion,  ablations.  No history of pre-eclampsia or prematurity.  No Fen-Phen.  Ambulatory BP not done.   Past Medical History:  Diagnosis Date  . Allergic rhinitis, unspecified   . Arthritis   . BMI 40.0-44.9, adult  ([place])   . Chronic fatigue   . Diabetes mellitus without complication ([place])   . Diverticulitis   . Hyperlipidemia   . Hypertension   . IBS (irritable bowel syndrome)   . Lupus ([place])   . Major depressive disorder, recurrent episode, moderate ([place])   . Mild nonproliferative diabetic retinopathy of both eyes without macular edema ([facility])   . Morbid obesity ([place])   . SLE (systemic lupus erythematosus related syndrome) ([place])     Past Surgical History:  Procedure Laterality Date  . BREAST BIOPSY Left 2015   US guided    Current Medications: Current Meds  Medication Sig  . acetaminophen (TYLENOL) 500 MG tablet Take 500 mg by mouth every 6 (six) hours as needed.  . ALPRAZolam (XANAX) 0.25 MG tablet Take by mouth as needed. Panic attacks  . citalopram (CELEXA) 20 MG tablet Take 20 mg by mouth daily.  . Dulaglutide (TRULICITY) 1.5 IO/2.7OJ SOPN Inject into the skin.  . Empagliflozin-metFORMIN HCl (SYNJARDY) 12.08-998 MG TABS Take by mouth.  . furosemide (LASIX) 40 MG tablet Take 40 mg by mouth daily.   Marland Kitchen glimepiride (AMARYL) 2 MG tablet Take 2 mg by mouth daily in the afternoon.  Marland Kitchen losartan (COZAAR) 100 MG tablet Take 100 mg by mouth every morning.   . meclizine (ANTIVERT) 25 MG tablet TAKE 1 TABLET BY MOUTH THREE TIMES DAILY FOR  10 DAYS AS NEEDED FOR VERTIGO  . meloxicam (MOBIC) 15 MG tablet Take by mouth.  . metoprolol tartrate (LOPRESSOR) 50 MG tablet Take one tablet by mouth 2 hours prior to cardiac CT Scan  . ondansetron (ZOFRAN) 8 MG tablet TAKE 1 TABLET BY MOUTH THREE TIMES DAILY FOR 5 DAYS AS NEEDED FOR NAUSEA  . potassium chloride (K-DUR) 10 MEQ tablet Take 10 mEq by mouth every morning.   . simvastatin (ZOCOR) 20 MG tablet Take 20 mg by mouth at bedtime.   Marland Kitchen VITAMIN D, CHOLECALCIFEROL, PO Take 5,000 Units by mouth.     Allergies:   Ciprofloxacin, Keflex [cephalexin], Lisinopril, and Sulfa antibiotics   Social History   Socioeconomic History  . Marital status: Single     Spouse name: Not on file  . Number of children: Not on file  . Years of education: Not on file  . Highest education level: Not on file  Occupational History  . Not on file  Tobacco Use  . Smoking status: Never Smoker  . Smokeless tobacco: Never Used  Substance and Sexual Activity  . Alcohol use: Yes    Alcohol/week: 1.0 standard drink    Types: 1 Standard drinks or equivalent per week  . Drug use: Never  . Sexual activity: Not on file  Other Topics Concern  . Not on file  Social History Narrative  . Not on file   Social Determinants of Health   Financial Resource Strain: Not on file  Food Insecurity: Not on file  Transportation Needs: Not on file  Physical Activity: Not on file  Stress: Not on file  Social Connections: Not on file     Family History: The patient's family history includes Breast cancer (age of onset: 9) in her mother.  History of coronary artery disease notable for no members. History of heart failure notable for aunt, mother. History of arrhythmia notable for no members. No history of bicuspid aortic valve or aortic aneurysm or dissection.  ROS:   Please see the history of present illness.     All other systems reviewed and are negative.  EKGs/Labs/Other Studies Reviewed:    The following studies were reviewed today:  EKG:   05/03/20: SR rate 74 WNL 03/31/2018:  SR rate 79 non-specific TWI   Recent Labs: No results found for requested labs within last 8760 hours.  Recent Lipid Panel No results found for: CHOL, TRIG, HDL, CHOLHDL, VLDL, LDLCALC, LDLDIRECT  OSH Cholesterol labs: 03/11/2020 Total 158 HDL 58 LDL 78 TGs 125  Risk Assessment/Calculations:     ASCVD risk greater than 17.5% (+ Lupus)  Physical Exam:    VS:  BP 120/74   Pulse 74   Ht 5' 7"  (1.702 m)   Wt 276 lb (125.2 kg)   SpO2 98%   BMI 43.23 kg/m     Wt Readings from Last 3 Encounters:  05/03/20 276 lb (125.2 kg)  04/29/20 275 lb (124.7 kg)  03/30/18 268 lb (121.6  kg)     GEN: Obese, well developed in no acute distress HEENT: Normal NECK: No JVD; No carotid bruits LYMPHATICS: No lymphadenopathy CARDIAC: RRR, no murmurs, rubs, gallops RESPIRATORY:  Clear to auscultation without rales, wheezing or rhonchi  ABDOMEN: Soft, non-tender, non-distended MUSCULOSKELETAL:  No edema; No deformity  SKIN: Warm and dry NEUROLOGIC:  Alert and oriented x 3 PSYCHIATRIC:  Normal affect   ASSESSMENT:    1. Chest pain of uncertain etiology   2. Obesity, diabetes, and hypertension syndrome ([place])  3. Mixed hyperlipidemia   4. Systemic lupus erythematosus, unspecified SLE type, unspecified organ involvement status ([place])   5. DOE (dyspnea on exertion)    PLAN:    In order of problems listed above:  Chest Pain SLE - The patient presents with possibly cardiac chest pain - ASCVD risk estimated at greater than 17.5% with the additional risk factor of Lupus - Continue statin - Would recommend an echocardiogram to assess LVEF and exclude WMA.  - Would recommend CCTA +/- FFR to exclude obstructive CAD   Obesity/DM/HTN Orthostatic Hypotension Vasosvagal syncope - ambulatory blood pressure 120/80, will continue ambulatory BP monitoring; gave education on how to perform ambulatory blood pressure monitoring including the frequency and technique; goal ambulatory blood pressure < 140/85 on average (allowing slightly higher BP goal in the setting of  - discussed diet (DASH/low sodium), and exercise/weight loss interventions   Hyperlipidemia (familial/mixed) -LDL goal less than 100 - Fasting triglycerides notable for -continue current statin - gave education on dietary changes  2-3 months follow up unless new symptoms or abnormal test results warranting change in plan  Would be reasonable for  APP Follow up    Medication Adjustments/Labs and Tests Ordered: Current medicines are reviewed at length with the patient today.  Concerns regarding medicines are outlined  above.  Orders Placed This Encounter  Procedures  . CT CORONARY MORPH W/CTA COR W/SCORE W/CA W/CM &/OR WO/CM  . CT CORONARY FRACTIONAL FLOW RESERVE DATA PREP  . CT CORONARY FRACTIONAL FLOW RESERVE FLUID ANALYSIS  . Basic Metabolic Panel (BMET)  . EKG 12-Lead  . ECHOCARDIOGRAM COMPLETE   Meds ordered this encounter  Medications  . metoprolol tartrate (LOPRESSOR) 50 MG tablet    Sig: Take one tablet by mouth 2 hours prior to cardiac CT Scan    Dispense:  1 tablet    Refill:  0    Patient Instructions  Medication Instructions:  Your physician recommends that you continue on your current medications as directed. Please refer to the Current Medication list given to you today.  *If you need a refill on your cardiac medications before your next appointment, please call your pharmacy*   Lab Work: Lab work to be done today--BMP If you have labs (blood work) drawn today and your tests are completely normal, you will receive your results only by: Marland Kitchen MyChart Message (if you have MyChart) OR . A paper copy in the mail If you have any lab test that is abnormal or we need to change your treatment, we will call you to review the results.   Testing/Procedures: Your physician has requested that you have an echocardiogram. Echocardiography is a painless test that uses sound waves to create images of your heart. It provides your doctor with information about the size and shape of your heart and how well your heart's chambers and valves are working. This procedure takes approximately one hour. There are no restrictions for this procedure.  Your physician has requested that you have cardiac CT. Cardiac computed tomography (CT) is a painless test that uses an x-ray machine to take clear, detailed pictures of your heart. For further information please visit [email or web address].[email or web address]. Please follow instruction sheet as given.      Follow-Up: At [facility], you and your health needs are our priority.   As part of our continuing mission to provide you with exceptional heart care, we have created designated Provider Care Teams.  These Care Teams include your primary Cardiologist (physician) and Advanced Practice Providers (APPs -  Physician Assistants and Nurse Practitioners) who all work together to provide you with the care you need, when you need it.  We recommend signing up for the patient portal called "MyChart".  Sign up information is provided on this After Visit Summary.  MyChart is used to connect with patients for Virtual Visits (Telemedicine).  Patients are able to view lab/test results, encounter notes, upcoming appointments, etc.  Non-urgent messages can be sent to your provider as well.   To learn more about what you can do with MyChart, go to [email or web address].    Your next appointment:   2-3 month(s)  The format for your next appointment:   In Person  Provider:   You may see Dr Gasper Sells  or one of the following Advanced Practice Providers on your designated Care Team:    Melina Copa, PA-C  Ermalinda Barrios, PA-C    Other Instructions  Your cardiac CT will be scheduled at one of the below locations:   [facility] [address] [place], [place] [address] [phone]  [place] [address] [place], [place] [address] [phone]  If scheduled at [facility], please arrive at the [facility] main entrance of [facility] 30 minutes prior to test start time. Proceed to the [facility] Radiology Department (first floor) to check-in and test prep.  If scheduled at [facility], please arrive 15 mins early for check-in and test prep.  Please follow these instructions carefully (unless otherwise directed):  Hold all erectile dysfunction medications at least 3 days (72 hrs) prior to test.  On the Night Before the Test: . Be sure to Drink plenty of  water. . Do not consume any caffeinated/decaffeinated beverages or chocolate 12 hours prior to your test. . Do not take any antihistamines 12 hours prior to your test.   On the Day of the Test: . Drink plenty of water. Do not drink any water within one hour of the test. . Do not eat any food 4 hours prior to the test. . You may take your regular medications prior to the test.  . Take metoprolol (Lopressor) two hours prior to test. . HOLD Furosemide/Hydrochlorothiazide morning of the test. . FEMALES- please wear underwire-free bra if available   *     After the Test: . Drink plenty of water. . After receiving IV contrast, you may experience a mild flushed feeling. This is normal. . On occasion, you may experience a mild rash up to 24 hours after the test. This is not dangerous. If this occurs, you can take Benadryl 25 mg and increase your fluid intake. . If you experience trouble breathing, this can be serious. If it is severe call 911 IMMEDIATELY. If it is mild, please call our office. . If you take any of these medications: Glipizide/Metformin, Avandament, Glucavance, please do not take 48 hours after completing test unless otherwise instructed.   Once we have confirmed authorization from your insurance company, we will call you to set up a date and time for your test. Based on how quickly your insurance processes prior authorizations requests, please allow up to 4 weeks to be contacted for scheduling your Cardiac CT appointment. Be advised that routine Cardiac CT appointments could be scheduled as many as 8 weeks after your provider has ordered it.  For non-scheduling related questions, please contact the cardiac imaging nurse navigator should you have any questions/concerns: Marchia Bond, Cardiac Imaging Nurse  Navigator Burley Saver, Interim Cardiac Imaging Nurse Navigator [facility] Direct Office Dial: [phone]   For scheduling needs, including  cancellations and rescheduling, please call [place], [phone].       Signed, Werner Lean, MD  05/03/2020 2:29 PM    [place]

## 2020-05-04 LAB — BASIC METABOLIC PANEL
BUN/Creatinine Ratio: 14 (ref 12–28)
BUN: 13 mg/dL (ref 8–27)
CO2: 22 mmol/L (ref 20–29)
Calcium: 9.9 mg/dL (ref 8.7–10.3)
Chloride: 101 mmol/L (ref 96–106)
Creatinine, Ser: 0.96 mg/dL (ref 0.57–1.00)
GFR calc Af Amer: 71 mL/min/{1.73_m2} (ref >59)
GFR calc non Af Amer: 62 mL/min/{1.73_m2} (ref >59)
Glucose: 114 mg/dL — ABNORMAL HIGH (ref 65–99)
Potassium: 4.2 mmol/L (ref 3.5–5.2)
Sodium: 138 mmol/L (ref 134–144)

## 2020-05-06 NOTE — Telephone Encounter (Signed)
Completed 05/02/20

## 2020-05-15 ENCOUNTER — Other Ambulatory Visit: Payer: Self-pay

## 2020-05-15 ENCOUNTER — Ambulatory Visit: Payer: 59 | Attending: Neurology | Admitting: Physical Therapy

## 2020-05-15 DIAGNOSIS — R42 Dizziness and giddiness: Secondary | ICD-10-CM | POA: Insufficient documentation

## 2020-05-15 DIAGNOSIS — R2681 Unsteadiness on feet: Secondary | ICD-10-CM | POA: Diagnosis present

## 2020-05-15 NOTE — Therapy (Signed)
[facility] [address] [place], [place], [address] Phone: [phone]   Fax:  [phone]  Physical Therapy Evaluation  Patient Details  Name: RONNEISHA JETT MRN: [id number] Date of Birth: [birth year] Referring Provider (PT): Huston Foley, MD   Encounter Date: 05/15/2020   PT End of Session - 05/15/20 1331    Visit Number 1    Number of Visits 6    Date for PT Re-Evaluation 06/19/20    Authorization Type [employer]    PT Start Time 1242    PT Stop Time 1320    PT Time Calculation (min) 38 min    Activity Tolerance Patient tolerated treatment well    Behavior During Therapy [facility] for tasks assessed/performed           Past Medical History:  Diagnosis Date  . Allergic rhinitis, unspecified   . Arthritis   . BMI 40.0-44.9, adult (HCC)   . Chronic fatigue   . Diabetes mellitus without complication (HCC)   . Diverticulitis   . Hyperlipidemia   . Hypertension   . IBS (irritable bowel syndrome)   . Lupus (HCC)   . Major depressive disorder, recurrent episode, moderate (HCC)   . Mild nonproliferative diabetic retinopathy of both eyes without macular edema (HCC)   . Morbid obesity (HCC)   . SLE (systemic lupus erythematosus related syndrome) (HCC)     Past Surgical History:  Procedure Laterality Date  . BREAST BIOPSY Left 2015   US guided    There were no vitals filed for this visit.    Subjective Assessment - 05/15/20 1252    Subjective Pt reports she gets vertigo attacks -- when she gets it she gets it for a week. Pt reports she's only had it really bad 3x in her life. Her last bad attack was in October 2021. She states she is only okay if she is laying still. Pt reports she doesn't get it every day. Pt states on most days she has issues with orthostatic BP. Pt reports she knows she is supposed to stop and wait between positions to allow her BP to adjust. Pt reports she went to ENT but he did not think it was BPPV  like her PCP (they performed testing).    Pertinent History hyperlipidemia, diabetes, hypertension, arthritis, OSA (no longer on CPAP), lupus, and severe obesity with a BMI of over 40    Limitations Standing;House hold activities;Walking    How long can you sit comfortably? n/a    How long can you stand comfortably? n/a    How long can you walk comfortably? n/a    Diagnostic tests MRI ordered but not yet scheduled    Patient Stated Goals Improve dizziness    Currently in Pain? No/denies              [facility] PT Assessment - 05/15/20 0001      Assessment   Medical Diagnosis R42 (ICD-10-CM) - Paroxysmal vertigo    Referring Provider (PT) Huston Foley, MD    Onset Date/Surgical Date --   October 2021   Prior Therapy For broken hand and shoulder ~2 years ago      Precautions   Precautions Fall      Restrictions   Weight Bearing Restrictions No      Balance Screen   Has the patient fallen in the past 6 months No      Home Environment   Living Environment Private residence    Living Arrangements  Parent    Available Help at Discharge Family    Type of Home House    Home Access Stairs to enter    Entrance Stairs-Number of Steps 1   from garage   Home Layout Two level    Alternate Level Stairs-Number of Steps 16    Alternate Level Stairs-Rails Can reach both      Prior Function   Level of Independence Independent    Vocation Full time employment    Vocation Requirements Works from home      Observation/Other Assessments   Focus on Therapeutic Outcomes (FOTO)  47      Ambulation/Gait   Ambulation Distance (Feet) 230 Feet    Assistive device None    Gait Pattern Step-through pattern    Ambulation Surface Level;Indoor    Gait velocity 3.8 ft/sec      Standardized Balance Assessment   Standardized Balance Assessment Five Times Sit to Stand    Five times sit to stand comments  13.94 sec      Functional Gait  Assessment   Gait assessed  Yes    Gait Level Surface Walks 20  ft in less than 5.5 sec, no assistive devices, good speed, no evidence for imbalance, normal gait pattern, deviates no more than 6 in outside of the 12 in walkway width.   5.26   Change in Gait Speed Able to smoothly change walking speed without loss of balance or gait deviation. Deviate no more than 6 in outside of the 12 in walkway width.    Gait with Horizontal Head Turns Performs head turns smoothly with slight change in gait velocity (eg, minor disruption to smooth gait path), deviates 6-10 in outside 12 in walkway width, or uses an assistive device.    Gait with Vertical Head Turns Performs head turns with no change in gait. Deviates no more than 6 in outside 12 in walkway width.    Gait and Pivot Turn Pivot turns safely within 3 sec and stops quickly with no loss of balance.    Step Over Obstacle Is able to step over 2 stacked shoe boxes taped together (9 in total height) without changing gait speed. No evidence of imbalance.    Gait with Narrow Base of Support Ambulates less than 4 steps heel to toe or cannot perform without assistance.    Gait with Eyes Closed Walks 20 ft, uses assistive device, slower speed, mild gait deviations, deviates 6-10 in outside 12 in walkway width. Ambulates 20 ft in less than 9 sec but greater than 7 sec.   Veers right   Ambulating Backwards Walks 20 ft, uses assistive device, slower speed, mild gait deviations, deviates 6-10 in outside 12 in walkway width.    Steps Alternating feet, must use rail.   Limited due to bilat knee OA   Total Score 23    FGA comment: 23/30                  Vestibular Assessment - 05/15/20 0001      Symptom Behavior   Subjective history of current problem Pt reports last bad episode/vertigo attack Oct 2021. Pt woke up in the morning with intense vertigo provoked with movement. Pt cares for mom with Alzheimer's and cancer. Pt has a daughter with bipolar disorder.    Type of Dizziness  Vertigo    Frequency of Dizziness Every  few months    Duration of Dizziness Weeks    Symptom Nature Motion provoked;Intermittent;Positional  Aggravating Factors Turning head quickly;Driving;Sitting with head tilted back;Looking up to the ceiling;Rolling to right;Rolling to left   Only when she has one of the attacks   Relieving Factors Rest;Head stationary    Progression of Symptoms Better    History of similar episodes 2 other vertigo attacks previously      Oculomotor Exam   Oculomotor Alignment Normal    Ocular ROM WFL    Spontaneous Absent    Gaze-induced  Right beating nystagmus with L gaze   Very mild 1-2 beats   Head shaking Horizontal Absent    Head Shaking Vertical Absent    Smooth Pursuits Intact    Saccades Intact      Vestibulo-Ocular Reflex   VOR 1 Head Only (x 1 viewing) Mild symptoms    VOR 2 Head and Object (x 2 viewing) Mild symptoms    VOR to Slow Head Movement Normal    VOR Cancellation --   Mild symptoms   Comment HIT: (+) to R      Visual Acuity   Static 11    Dynamic 8              Objective measurements completed on examination: See above findings.               PT Education - 05/15/20 1446    Education Details Discussed exam findings and POC. Discussed paying attention to stressors as triggers to her vertigo.    Person(s) Educated Patient    Methods Explanation;Verbal cues    Comprehension Verbalized understanding            PT Short Term Goals - 05/15/20 1611      PT SHORT TERM GOAL #1   Title STG = LTGs             PT Long Term Goals - 05/15/20 1612      PT LONG TERM GOAL #1   Title Pt will be independent with her HEP    Time 5    Period Weeks    Status New    Target Date 06/19/20      PT LONG TERM GOAL #2   Title Pt will have improved dynamic visual acuity to at least line 9 to demo improved VOR function    Baseline Line 8 on eval    Time 5    Period Weeks    Status New    Target Date 06/19/20      PT LONG TERM GOAL #3   Title Pt will have  improved FGA to >/=26/30.    Baseline 23/30 on eval    Time 5    Period Weeks    Status New    Target Date 06/19/20      PT LONG TERM GOAL #4   Title Pt will have improved FOTO score to at least 57 and improved DPS by at least 10 points    Baseline FOTO 57, DPS 56.4    Time 5    Period Weeks    Status New    Target Date 06/19/20                  Plan - 05/15/20 1332    Clinical Impression Statement Pt is a 65 y/o F presenting to OPPT due to history of paroxysmal vertigo attacks. Pt reports she is currently not in an episode -- last attack was in October 2021. Pt's PMH is significant for hyperlipidemia, diabetes, hypertension, arthritis, OSA (no longer  on CPAP), and lupus. On assessment, pt with s/s consistent with R vestibular hypofunction with decreased balance as seen on her FGA. Pt with otherwise good functional strength. Pt would benefit from PT to optimize her level of function and provide her with adequate exercises and education in case one of her severe vertigo attacks gets triggered.    Personal Factors and Comorbidities Age;Comorbidity 1;Comorbidity 2;Comorbidity 3+;Time since onset of injury/illness/exacerbation    Comorbidities hyperlipidemia, diabetes, hypertension, arthritis, OSA (no longer on CPAP), lupus    Examination-Activity Limitations Stairs;Carry;Squat   Not currently limited due to her dizziness (mostly limited due to her bilat knee OA and lupus); but becomes severely limited during one of her attacks   Examination-Participation Restrictions Community Activity   Not currently limited due to her dizziness (mostly limited due to her bilat knee OA and lupus); but becomes severely limited during one of her attacks for participation in any activity   Stability/Clinical Decision Making Stable/Uncomplicated    Clinical Decision Making Low    Rehab Potential Good    PT Frequency 1x / week    PT Duration Other (comment)   5 weeks   PT Treatment/Interventions  ADLs/Self Care Home Management;Gait training;Stair training;Functional mobility training;Therapeutic activities;Therapeutic exercise;Balance training;Neuromuscular re-education;Patient/family education;Manual techniques;Passive range of motion;Dry needling;Taping;Vestibular    PT Next Visit Plan Recheck for any BPPV if indicated. Initiate gaze stabilization and balance exercises. Work on Scientist, forensic gait.    Consulted and Agree with Plan of Care Patient           Patient will benefit from skilled therapeutic intervention in order to improve the following deficits and impairments:  Dizziness,Decreased mobility,Decreased balance,Decreased activity tolerance  Visit Diagnosis: Dizziness and giddiness  Unsteadiness on feet     Problem List Patient Active Problem List   Diagnosis Date Noted  . Chest pain of uncertain etiology 05/03/2020  . Obesity, diabetes, and hypertension syndrome (HCC) 05/03/2020  . Mixed hyperlipidemia 05/03/2020  . Systemic lupus erythematosus (HCC) 05/03/2020    Laneta Guerin April Ma L Sheppton PT, DPT 05/15/2020, 4:21 PM  [facility] [address] [place], [place], [address] Phone: [phone]   Fax:  [phone]  Name: TERILYN SANO MRN: [id number] Date of Birth: 01-19-1956

## 2020-05-23 ENCOUNTER — Encounter: Payer: Self-pay | Admitting: Physical Therapy

## 2020-05-23 ENCOUNTER — Telehealth (HOSPITAL_COMMUNITY): Payer: Self-pay | Admitting: *Deleted

## 2020-05-23 ENCOUNTER — Telehealth (HOSPITAL_COMMUNITY): Payer: Self-pay | Admitting: Emergency Medicine

## 2020-05-23 ENCOUNTER — Ambulatory Visit: Payer: 59 | Admitting: Physical Therapy

## 2020-05-23 ENCOUNTER — Other Ambulatory Visit: Payer: Self-pay | Admitting: Internal Medicine

## 2020-05-23 ENCOUNTER — Other Ambulatory Visit: Payer: Self-pay

## 2020-05-23 DIAGNOSIS — R42 Dizziness and giddiness: Secondary | ICD-10-CM

## 2020-05-23 MED ORDER — METOPROLOL TARTRATE 50 MG PO TABS: ORAL_TABLET | ORAL | 0 refills | Status: DC

## 2020-05-23 NOTE — Telephone Encounter (Signed)
*  STAT* If patient is at the pharmacy, call can be transferred to refill team.   1. Which medications need to be refilled? (please list name of each medication and dose if known)   metoprolol tartrate (LOPRESSOR) 50 MG tablet   2. Which pharmacy/location (including street and city if local pharmacy) is medication to be sent to? [id number] - [place], [place] - [address]  3. Do they need a 30 day or 90 day supply? 1 tablet  Accidentally took medication today instead of tomorrow prior to test.

## 2020-05-23 NOTE — Telephone Encounter (Signed)
Pt calling requesting a refill on metoprolol, 1 time dose. Pt stated that she accidentally took the medication today instead of tomorrow, when CT scan is scheduled. Would Dr. Izora Ribas, like to reorder this medication again for the procedure scheduled for tomorrow? Please address

## 2020-05-23 NOTE — Telephone Encounter (Signed)
I spoke with patient.  She accidentally took metoprolol today.  Will send new prescription to [facility] on [address] and [address] for her to take tomorrow prior to CT.  I advised patient to watch for dizziness, low heart rate or low BP

## 2020-05-23 NOTE — Patient Instructions (Signed)
Benign Positional Vertigo Vertigo is the feeling that you or your surroundings are moving when they are not. Benign positional vertigo is the most common form of vertigo. This is usually a harmless condition (benign). This condition is positional. This means that symptoms are triggered by certain movements and positions. This condition can be dangerous if it occurs while you are doing something that could cause harm to you or others. This includes activities such as driving or operating machinery. What are the causes? The inner ear has fluid-filled canals that help your brain sense movement and balance. When the fluid moves, the brain receives messages about your body's position. With benign positional vertigo, crystals in the inner ear break free and disturb the inner ear area. This causes your brain to receive confusing messages about your body's position. What increases the risk? You are more likely to develop this condition if:  You are a woman.  You are 50 years of age or older.  You have recently had a head injury.  You have an inner ear disease. What are the signs or symptoms? Symptoms of this condition usually happen when you move your head or your eyes in different directions. Symptoms may start suddenly, and usually last for less than a minute. They include:  Loss of balance and falling.  Feeling like you are spinning or moving.  Feeling like your surroundings are spinning or moving.  Nausea and vomiting.  Blurred vision.  Dizziness.  Involuntary eye movement (nystagmus). Symptoms can be mild and cause only minor problems, or they can be severe and interfere with daily life. Episodes of benign positional vertigo may return (recur) over time. Symptoms may improve over time. How is this diagnosed? This condition may be diagnosed based on:  Your medical history.  Physical exam of the head, neck, and ears.  Positional tests to check for or stimulate vertigo. You may be  asked to turn your head and change positions, such as going from sitting to lying down. A health care provider will watch for symptoms of vertigo. You may be referred to a health care provider who specializes in ear, nose, and throat problems (ENT, or otolaryngologist) or a provider who specializes in disorders of the nervous system (neurologist). How is this treated? This condition may be treated in a session in which your health care provider moves your head in specific positions to help the displaced crystals in your inner ear move. Treatment for this condition may take several sessions. Surgery may be needed in severe cases, but this is rare. In some cases, benign positional vertigo may resolve on its own in 2-4 weeks.   Follow these instructions at home: Safety  Move slowly. Avoid sudden body or head movements or certain positions, as told by your health care provider.  Avoid driving until your health care provider says it is safe for you to do so.  Avoid operating heavy machinery until your health care provider says it is safe for you to do so.  Avoid doing any tasks that would be dangerous to you or others if vertigo occurs.  If you have trouble walking or keeping your balance, try using a cane for stability. If you feel dizzy or unstable, sit down right away.  Return to your normal activities as told by your health care provider. Ask your health care provider what activities are safe for you. General instructions  Take over-the-counter and prescription medicines only as told by your health care provider.  Drink enough fluid   to keep your urine pale yellow.  Keep all follow-up visits as told by your health care provider. This is important. Contact a health care provider if:  You have a fever.  Your condition gets worse or you develop new symptoms.  Your family or friends notice any behavioral changes.  You have nausea or vomiting that gets worse.  You have numbness or a  prickling and tingling sensation. Get help right away if you:  Have difficulty speaking or moving.  Are always dizzy.  Faint.  Develop severe headaches.  Have weakness in your legs or arms.  Have changes in your hearing or vision.  Develop a stiff neck.  Develop sensitivity to light. Summary  Vertigo is the feeling that you or your surroundings are moving when they are not. Benign positional vertigo is the most common form of vertigo.  This condition is caused by crystals in the inner ear that become displaced. This causes a disturbance in an area of the inner ear that helps your brain sense movement and balance.  Symptoms include loss of balance and falling, feeling that you or your surroundings are moving, nausea and vomiting, and blurred vision.  This condition can be diagnosed based on symptoms, a physical exam, and positional tests.  Follow safety instructions as told by your health care provider. You will also be told when to contact your health care provider in case of problems. This information is not intended to replace advice given to you by your health care provider. Make sure you discuss any questions you have with your health care provider. Document Revised: 03/07/2019 Document Reviewed: 09/22/2017 Elsevier Patient Education  2021 Elsevier Inc.     How to Perform the Epley Maneuver The Epley maneuver is an exercise that relieves symptoms of vertigo. Vertigo is the feeling that you or your surroundings are moving when they are not. When you feel vertigo, you may feel like the room is spinning and may have trouble walking. The Epley maneuver is used for a type of vertigo caused by a calcium deposit in a part of the inner ear. The maneuver involves changing head positions to help the deposit move out of the area. You can do this maneuver at home whenever you have symptoms of vertigo. You can repeat it in 24 hours if your vertigo has not gone away. Even though the  Epley maneuver may relieve your vertigo for a few weeks, it is possible that your symptoms will return. This maneuver relieves vertigo, but it does not relieve dizziness. What are the risks? If it is done correctly, the Epley maneuver is considered safe. Sometimes it can lead to dizziness or nausea that goes away after a short time. If you develop other symptoms--such as changes in vision, weakness, or numbness--stop doing the maneuver and call your health care provider. Supplies needed:  A bed or table.  A pillow. How to do the Epley maneuver 1. Sit on the edge of a bed or table with your back straight and your legs extended or hanging over the edge of the bed or table. 2. Turn your head halfway toward the affected ear or side as told by your health care provider. 3. Lie backward quickly with your head turned until you are lying flat on your back. You may want to position a pillow under your shoulders. 4. Hold this position for at least 30 seconds. If you feel dizzy or have symptoms of vertigo, continue to hold the position until the symptoms stop.   Turn your head to the opposite direction until your unaffected ear is facing the floor. 6. Hold this position for at least 30 seconds. If you feel dizzy or have symptoms of vertigo, continue to hold the position until the symptoms stop. 7. Turn your whole body to the same side as your head so that you are positioned on your side. Your head will now be nearly facedown. Hold for at least 30 seconds. If you feel dizzy or have symptoms of vertigo, continue to hold the position until the symptoms stop. 8. Sit back up. You can repeat the maneuver in 24 hours if your vertigo does not go away.      Follow these instructions at home: For 24 hours after doing the Epley maneuver:  Keep your head in an upright position.  When lying down to sleep or rest, keep your head raised (elevated) with two or more pillows.  Avoid excessive neck  movements. Activity  Do not drive or use machinery if you feel dizzy.  After doing the Epley maneuver, return to your normal activities as told by your health care provider. Ask your health care provider what activities are safe for you. General instructions  Drink enough fluid to keep your urine pale yellow.  Do not drink alcohol.  Take over-the-counter and prescription medicines only as told by your health care provider.  Keep all follow-up visits as told by your health care provider. This is important. Preventing vertigo symptoms Ask your health care provider if there is anything you should do at home to prevent vertigo. He or she may recommend that you:  Keep your head elevated with two or more pillows while you sleep.  Do not sleep on the side of your affected ear.  Get up slowly from bed.  Avoid sudden movements during the day.  Avoid extreme head positions or movement, such as looking up or bending over. Contact a health care provider if:  Your vertigo gets worse.  You have other symptoms, including: ? Nausea. ? Vomiting. ? Headache. Get help right away if you:  Have vision changes.  Have a headache or neck pain that is severe or getting worse.  Cannot stop vomiting.  Have new numbness or weakness in any part of your body. Summary  Vertigo is the feeling that you or your surroundings are moving when they are not.  The Epley maneuver is an exercise that relieves symptoms of vertigo.  If the Epley maneuver is done correctly, it is considered safe and relieves vertigo quickly. This information is not intended to replace advice given to you by your health care provider. Make sure you discuss any questions you have with your health care provider. Document Revised: 02/08/2019 Document Reviewed: 02/08/2019 Elsevier Patient Education  2021 Elsevier Inc.             Self Treatment for Right Posterior / Anterior Canalithiasis    Sitting on bed: 1.  Turn head 45 right. (a) Lie back slowly, shoulders on pillow, head on bed. (b) Hold _20-30___ seconds. 2. Keeping head on bed, turn head 90 left. Hold _20-30___ seconds. 3. Roll to left, head on 45 angle down toward bed. Hold _20-30___ seconds. 4. Sit up on left side of bed. Repeat __2-3__ times per session. Do _2___ sessions per day.  Marland Kitchen

## 2020-05-23 NOTE — Telephone Encounter (Signed)
Pt returning call regarding upcoming cardiac imaging study; pt verbalizes understanding of appt date/time, parking situation and where to check in, pre-test NPO status and medications ordered, and verified current allergies; name and call back number provided for further questions should they arise  Sheena Donegan RN Navigator Cardiac Imaging [facility] [phone] office [phone] cell  

## 2020-05-23 NOTE — Telephone Encounter (Signed)
Attempted to call patient regarding upcoming cardiac CT appointment. °Left message on voicemail with name and callback number °Sotirios Navarro RN Navigator Cardiac Imaging °[facility] °[phone] Office °[phone] Cell ° °

## 2020-05-24 ENCOUNTER — Ambulatory Visit (HOSPITAL_COMMUNITY)
Admission: RE | Admit: 2020-05-24 | Discharge: 2020-05-24 | Disposition: A | Discharge status: Home / Self Care | Payer: 59 | Source: Ambulatory Visit | Attending: Internal Medicine | Admitting: Internal Medicine

## 2020-05-24 ENCOUNTER — Encounter: Payer: Self-pay | Admitting: *Deleted

## 2020-05-24 ENCOUNTER — Encounter (HOSPITAL_COMMUNITY): Payer: Self-pay

## 2020-05-24 DIAGNOSIS — R079 Chest pain, unspecified: Secondary | ICD-10-CM

## 2020-05-24 DIAGNOSIS — R0600 Dyspnea, unspecified: Secondary | ICD-10-CM | POA: Insufficient documentation

## 2020-05-24 DIAGNOSIS — R0609 Other forms of dyspnea: Secondary | ICD-10-CM

## 2020-05-24 DIAGNOSIS — Z006 Encounter for examination for normal comparison and control in clinical research program: Secondary | ICD-10-CM

## 2020-05-24 MED ORDER — NITROGLYCERIN 0.4 MG SL SUBL: 0.8000 mg | SUBLINGUAL_TABLET | Freq: Once | SUBLINGUAL | Status: DC

## 2020-05-24 NOTE — Progress Notes (Signed)
CT scan rescheduled, unable to get IV. CT navigator will call pt and reschedule with IR PA to start IV. Pt agree with plan.

## 2020-05-24 NOTE — Research (Signed)
IDENTIFY Informed Consent                  Subject Name:  Stacy Dickerson    Subject met inclusion and exclusion criteria.  The informed consent form, study requirements and expectations were reviewed with the subject and questions and concerns were addressed prior to the signing of the consent form.  The subject verbalized understanding of the trial requirements.  The subject agreed to participate in the IDENTIFY trial and signed the informed consent.  The informed consent was obtained prior to performance of any protocol-specific procedures for the subject.  A copy of the signed informed consent was given to the subject and a copy was placed in the subject's medical record.   [place] Rolan Wrightsman, Research Assistant 05/24/2020  15:00p.m.

## 2020-05-24 NOTE — Therapy (Signed)
[facility] [address] [place], [place], [address] Phone: [phone]   Fax:  [phone]  Physical Therapy Treatment  Patient Details  Name: Stacy Dickerson MRN: [id number] Date of Birth: 1955-05-17 Referring Provider (PT): Huston Foley, MD   Encounter Date: 05/23/2020   PT End of Session - 05/24/20 1336    Visit Number 2    Number of Visits 6    Date for PT Re-Evaluation 06/19/20    Authorization Type UHC    PT Start Time 1530    PT Stop Time 1617    PT Time Calculation (min) 47 min    Activity Tolerance Patient tolerated treatment well    Behavior During Therapy [facility] for tasks assessed/performed           Past Medical History:  Diagnosis Date  . Allergic rhinitis, unspecified   . Arthritis   . BMI 40.0-44.9, adult (HCC)   . Chronic fatigue   . Diabetes mellitus without complication (HCC)   . Diverticulitis   . Hyperlipidemia   . Hypertension   . IBS (irritable bowel syndrome)   . Lupus (HCC)   . Major depressive disorder, recurrent episode, moderate (HCC)   . Mild nonproliferative diabetic retinopathy of both eyes without macular edema (HCC)   . Morbid obesity (HCC)   . SLE (systemic lupus erythematosus related syndrome) (HCC)     Past Surgical History:  Procedure Laterality Date  . BREAST BIOPSY Left 2015   US guided    There were no vitals filed for this visit.   Subjective Assessment - 05/23/20 1528    Subjective Pt reports the vertigo is ok right now; pt states she had vertigo twice last year; states Rt ear "kept plugging up on me"; pt states her most recent episode was in Sept. 2021; denies tinnitus, high pitched sounds hearing loss in Lt ear;  fullness in Rt ear when she had the full blown episode in Sept. 2021    Pertinent History hyperlipidemia, diabetes, hypertension, arthritis, OSA (no longer on CPAP), lupus, and severe obesity with a BMI of over 40    Limitations Standing;House hold  activities;Walking    How long can you sit comfortably? n/a    How long can you stand comfortably? n/a    How long can you walk comfortably? n/a    Diagnostic tests MRI ordered but not yet scheduled    Patient Stated Goals Improve dizziness    Currently in Pain? No/denies                   Vestibular Assessment - 05/24/20 0001      Positional Testing   Dix-Hallpike Dix-Hallpike Right;Dix-Hallpike Left    Sidelying Test Sidelying Right;Sidelying Left      Dix-Hallpike Right   Dix-Hallpike Right Duration approx. 15 secs    Dix-Hallpike Right Symptoms No nystagmus      Dix-Hallpike Left   Dix-Hallpike Left Duration none    Dix-Hallpike Left Symptoms No nystagmus      Sidelying Right   Sidelying Right Duration none   c/o dizziness with return to upright position   Sidelying Right Symptoms No nystagmus      Sidelying Left   Sidelying Left Duration none    Sidelying Left Symptoms No nystagmus                     Vestibular Treatment/Exercise - 05/24/20 0001      Vestibular Treatment/Exercise   Vestibular  Treatment Provided Canalith Repositioning    Canalith Repositioning Epley Manuever Right       EPLEY MANUEVER RIGHT   Number of Reps  2    Overall Response Improved Symptoms    Response Details  min nystagmus noted in initial position on 2nd rep                 PT Education - 05/24/20 1335    Education Details info given on Epley maneuver for self treatment, etiology of BPPV from VEDA and info on Hydrops regarding diet recommendations    Person(s) Educated Patient    Methods Explanation;Handout;Demonstration    Comprehension Verbalized understanding            PT Short Term Goals - 05/15/20 1611      PT SHORT TERM GOAL #1   Title STG = LTGs             PT Long Term Goals - 05/24/20 1339      PT LONG TERM GOAL #1   Title Pt will be independent with her HEP    Time 5    Period Weeks    Status New      PT LONG TERM GOAL #2    Title Pt will have improved dynamic visual acuity to at least line 9 to demo improved VOR function    Baseline Line 8 on eval    Time 5    Period Weeks    Status New      PT LONG TERM GOAL #3   Title Pt will have improved FGA to >/=26/30.    Baseline 23/30 on eval    Time 5    Period Weeks    Status New      PT LONG TERM GOAL #4   Title Pt will have improved FOTO score to at least 57 and improved DPS by at least 10 points    Baseline FOTO 57, DPS 56.4    Time 5    Period Weeks    Status New                 Plan - 05/24/20 1337    Clinical Impression Statement Pt had (+) Rt Dix-Hallpike test indicative of Rt BPPV; symptoms much improved on 2nd rep of Epley maneuver.  Cont to assess for resolution of Rt posterior canalithiasis.    Personal Factors and Comorbidities Age;Comorbidity 1;Comorbidity 2;Comorbidity 3+;Time since onset of injury/illness/exacerbation    Comorbidities hyperlipidemia, diabetes, hypertension, arthritis, OSA (no longer on CPAP), lupus    Examination-Activity Limitations Stairs;Carry;Squat   Not currently limited due to her dizziness (mostly limited due to her bilat knee OA and lupus); but becomes severely limited during one of her attacks   Examination-Participation Restrictions Community Activity   Not currently limited due to her dizziness (mostly limited due to her bilat knee OA and lupus); but becomes severely limited during one of her attacks for participation in any activity   Stability/Clinical Decision Making Stable/Uncomplicated    Rehab Potential Good    PT Frequency 1x / week    PT Duration Other (comment)   5 weeks   PT Treatment/Interventions ADLs/Self Care Home Management;Gait training;Stair training;Functional mobility training;Therapeutic activities;Therapeutic exercise;Balance training;Neuromuscular re-education;Patient/family education;Manual techniques;Passive range of motion;Dry needling;Taping;Vestibular    PT Next Visit Plan Recheck  for Rt BPPV;  Initiate gaze stabilization and balance exercises. Work on Scientist, forensic gait.    Consulted and Agree with Plan of Care Patient  Patient will benefit from skilled therapeutic intervention in order to improve the following deficits and impairments:  Dizziness,Decreased mobility,Decreased balance,Decreased activity tolerance  Visit Diagnosis: Dizziness and giddiness     Problem List Patient Active Problem List   Diagnosis Date Noted  . Chest pain of uncertain etiology 05/03/2020  . Obesity, diabetes, and hypertension syndrome (HCC) 05/03/2020  . Mixed hyperlipidemia 05/03/2020  . Systemic lupus erythematosus (HCC) 05/03/2020    Gwendolyne Welford, Donavan Burnet, PT 05/24/2020, 1:41 PM  [place] [facility] [address] [place], [place], [address] Phone: [phone]   Fax:  [phone]  Name: PEARLEE ARVIZU MRN: [id number] Date of Birth: 1955/05/05

## 2020-05-27 ENCOUNTER — Ambulatory Visit (HOSPITAL_COMMUNITY): Payer: 59 | Attending: Cardiovascular Disease

## 2020-05-27 ENCOUNTER — Other Ambulatory Visit: Payer: Self-pay

## 2020-05-27 DIAGNOSIS — R0609 Other forms of dyspnea: Secondary | ICD-10-CM

## 2020-05-27 DIAGNOSIS — R0600 Dyspnea, unspecified: Secondary | ICD-10-CM | POA: Diagnosis not present

## 2020-05-27 DIAGNOSIS — R079 Chest pain, unspecified: Secondary | ICD-10-CM | POA: Insufficient documentation

## 2020-05-27 LAB — ECHOCARDIOGRAM COMPLETE
Area-P 1/2: 3.72 cm2
S' Lateral: 1.9 cm

## 2020-05-28 ENCOUNTER — Other Ambulatory Visit (HOSPITAL_COMMUNITY): Payer: Self-pay | Admitting: Emergency Medicine

## 2020-05-28 ENCOUNTER — Other Ambulatory Visit (HOSPITAL_COMMUNITY): Payer: Self-pay | Admitting: Internal Medicine

## 2020-05-28 ENCOUNTER — Telehealth (HOSPITAL_COMMUNITY): Payer: Self-pay | Admitting: Emergency Medicine

## 2020-05-28 DIAGNOSIS — R079 Chest pain, unspecified: Secondary | ICD-10-CM

## 2020-05-28 DIAGNOSIS — I25118 Atherosclerotic heart disease of native coronary artery with other forms of angina pectoris: Secondary | ICD-10-CM

## 2020-05-28 MED ORDER — METOPROLOL TARTRATE 50 MG PO TABS: ORAL_TABLET | ORAL | 0 refills | Status: DC

## 2020-05-28 NOTE — Telephone Encounter (Signed)
Calling to r/s CCTA appt with IR IV start. Scheduled for IR IV start on 05/31/20 at 3p. Scheduled for CCTA on 05/31/20 at 4p.   pt verbalizes understanding of appt date/time, parking situation and where to check in, pre-test NPO status and medications ordered, and verified current allergies; name and call back number provided for further questions should they arise Rockwell Alexandria RN Navigator Cardiac Imaging [facility] [phone] office [phone] cell

## 2020-05-30 ENCOUNTER — Other Ambulatory Visit: Payer: Self-pay

## 2020-05-30 ENCOUNTER — Encounter: Payer: Self-pay | Admitting: Physical Therapy

## 2020-05-30 ENCOUNTER — Ambulatory Visit: Payer: 59 | Attending: Neurology | Admitting: Physical Therapy

## 2020-05-30 DIAGNOSIS — R42 Dizziness and giddiness: Secondary | ICD-10-CM | POA: Diagnosis present

## 2020-05-30 DIAGNOSIS — R2681 Unsteadiness on feet: Secondary | ICD-10-CM | POA: Insufficient documentation

## 2020-05-31 ENCOUNTER — Ambulatory Visit (HOSPITAL_COMMUNITY)
Admission: RE | Admit: 2020-05-31 | Discharge: 2020-05-31 | Disposition: A | Discharge status: Home / Self Care | Payer: 59 | Source: Ambulatory Visit | Attending: Internal Medicine | Admitting: Internal Medicine

## 2020-05-31 DIAGNOSIS — I25118 Atherosclerotic heart disease of native coronary artery with other forms of angina pectoris: Secondary | ICD-10-CM

## 2020-05-31 DIAGNOSIS — R079 Chest pain, unspecified: Secondary | ICD-10-CM | POA: Insufficient documentation

## 2020-05-31 DIAGNOSIS — R0600 Dyspnea, unspecified: Secondary | ICD-10-CM | POA: Diagnosis present

## 2020-05-31 MED ORDER — NITROGLYCERIN 0.4 MG SL SUBL
SUBLINGUAL_TABLET | SUBLINGUAL | Status: AC
  Filled 2020-05-31: qty 2

## 2020-05-31 MED ORDER — NITROGLYCERIN 0.4 MG SL SUBL
0.8000 mg | SUBLINGUAL_TABLET | Freq: Once | SUBLINGUAL | Status: AC
  Administered 2020-05-31: 0.8 mg via SUBLINGUAL

## 2020-05-31 MED ORDER — IOHEXOL 350 MG/ML SOLN
80.0000 mL | Freq: Once | INTRAVENOUS | Status: AC | PRN
  Administered 2020-05-31: 80 mL via INTRAVENOUS

## 2020-05-31 NOTE — Patient Instructions (Signed)
  Feet Together (Compliant Surface) Varied Arm Positions - Eyes Open    With eyes open, standing on compliant surface: _pillows or cushion_______, feet together and arms out, look at a stationary object. Hold _30__ seconds. Repeat __2_ times per session. Do __1__ sessions per day. Perform horizontal and vertical head turns - with Eyes open and then with eyes closed. 5 reps each      Feet Apart (Compliant Surface) Head Motion - Eyes Closed    Stand on compliant surface: pillows________ with feet shoulder width apart. Close eyes and move head slowly, up and down. Repeat _2___ times per session. Do _1-2___ sessions per day.  FEET APART, THEN WITH FEET TOGETHER; PERFORM HEAD TURNS - HORIZONTAL AND VERTICAL - 5 REPS EACH DIRECTION

## 2020-05-31 NOTE — Therapy (Signed)
[facility] [address] [place], [place], [address] Phone: [phone]   Fax:  [phone]  Physical Therapy Treatment  Patient Details  Name: Stacy Dickerson MRN: [id number] Date of Birth: January 30, 1956 Referring Provider (Stacy Dickerson): Huston Foley, MD   Encounter Date: 05/30/2020   Stacy Dickerson End of Session - 05/31/20 1226    Visit Number 3    Number of Visits 6    Date for Stacy Dickerson Re-Evaluation 06/19/20    Authorization Type UHC    Stacy Dickerson Start Time 1532    Stacy Dickerson Stop Time 1616    Stacy Dickerson Time Calculation (min) 44 min    Activity Tolerance Patient tolerated treatment well    Behavior During Therapy [facility] for tasks assessed/performed           Past Medical History:  Diagnosis Date  . Allergic rhinitis, unspecified   . Arthritis   . BMI 40.0-44.9, adult (HCC)   . Chronic fatigue   . Diabetes mellitus without complication (HCC)   . Diverticulitis   . Hyperlipidemia   . Hypertension   . IBS (irritable bowel syndrome)   . Lupus (HCC)   . Major depressive disorder, recurrent episode, moderate (HCC)   . Mild nonproliferative diabetic retinopathy of both eyes without macular edema (HCC)   . Morbid obesity (HCC)   . SLE (systemic lupus erythematosus related syndrome) (HCC)     Past Surgical History:  Procedure Laterality Date  . BREAST BIOPSY Left 2015   US guided    There were no vitals filed for this visit.   Subjective Assessment - 05/30/20 1537    Subjective Stacy Dickerson reports she had a little bit of vertigo 2 evenings ago (Tuesday evening this week); was short duration but was spinning; Stacy Dickerson states she has a little bit of a sinus headache but "it's not there yet"    Pertinent History hyperlipidemia, diabetes, hypertension, arthritis, OSA (no longer on CPAP), lupus, and severe obesity with a BMI of over 40    Limitations Standing;House hold activities;Walking    How long can you sit comfortably? n/a    How long can you stand comfortably? n/a     How long can you walk comfortably? n/a    Diagnostic tests MRI ordered but not yet scheduled    Patient Stated Goals Improve dizziness    Currently in Pain? No/denies                   Vestibular Assessment - 05/31/20 0001      Visual Acuity   Static line 11    Dynamic line 7   Stacy Dickerson states that in the initial test on 05-15-20 evaluation - she actively performed the head turns herself; states that I (Stacy Dickerson) moved her head faster than the speed she actively did it at eval; Stacy Dickerson denied any dizziness upon completion of test;     Positional Testing   Dix-Hallpike Dix-Hallpike Right      Dix-Hallpike Right   Dix-Hallpike Right Duration approx. 10 sec   very mild intensity - delayed onset (approx. 5 secs delay); Stacy Dickerson reported she felt "floaty" in test position on 1st rep - Stacy Dickerson denied room spinning vertigo in test position   Dix-Hallpike Right Symptoms Upbeat, right rotatory nystagmus                     Vestibular Treatment/Exercise - 05/31/20 0001      Vestibular Treatment/Exercise   Vestibular Treatment Provided Canalith Repositioning  Canalith Repositioning Epley Manuever Right    Gaze Exercises X1 Viewing Horizontal;X1 Viewing Vertical       EPLEY MANUEVER RIGHT   Number of Reps  2    Overall Response Improved Symptoms    Response Details  no nystagmus and no symptoms reported on 2nd rep in any position;  Stacy Dickerson stated she did not experience the "floaty sensation" she had felt on the 1st rep      X1 Viewing Horizontal   Foot Position bil. stance - approx. 6' away from target    Time --   60 secs   Reps 1    Comments plain background      X1 Viewing Vertical   Foot Position bil. stance - approx. 6' away from target    Time --   60 secs   Reps 1    Comments plain background              Balance Exercises - 05/31/20 0001      Balance Exercises: Standing   Standing Eyes Opened Narrow base of support (BOS);Wide (BOA);Head turns;Foam/compliant surface;5 reps    horizontal and vertical head turns   Standing Eyes Closed Narrow base of support (BOS);Wide (BOA);Head turns;Foam/compliant surface;5 reps   horizontal & vertical head turns   SLS Eyes open;2 reps;Solid surface   attempt 5 sec hold - Stacy Dickerson able to perform SLS approx. 3 secs on RLE, approx. 1 sec on LLE; Stacy Dickerson states she does not want this exercise for HEP            Stacy Dickerson Education - 05/31/20 1225    Education Details added balance on foam - feet apart and together - with EO and with EC - for increased vestibular input in maintaining balance    Person(s) Educated Patient    Methods Explanation;Demonstration;Handout    Comprehension Verbalized understanding;Returned demonstration            Stacy Dickerson Short Term Goals - 05/15/20 1611      Stacy Dickerson SHORT TERM GOAL #1   Title STG = LTGs             Stacy Dickerson Long Term Goals - 05/30/20 1554      Stacy Dickerson LONG TERM GOAL #1   Title Stacy Dickerson will be independent with her HEP    Time 5    Period Weeks    Status New      Stacy Dickerson LONG TERM GOAL #2   Title Stacy Dickerson will have improved dynamic visual acuity to at least line 9 to demo improved VOR function    Baseline Line 8 on eval    Time 5    Period Weeks    Status New      Stacy Dickerson LONG TERM GOAL #3   Title Stacy Dickerson will have improved FGA to >/=26/30.    Baseline 23/30 on eval    Time 5    Period Weeks    Status New      Stacy Dickerson LONG TERM GOAL #4   Title Stacy Dickerson will have improved FOTO score to at least 57 and improved DPS by at least 10 points    Baseline FOTO 57, DPS 56.4    Time 5    Period Weeks    Status New                 Plan - 05/30/20 1538    Clinical Impression Statement Stacy Dickerson had mild upbeat Rt rotary nystagmus in Dix-Hallpike test position, indicative of Rt BPPV posterior  canalithiasis.  Symptoms resolved on 2nd rep of Epley maneuver.  Stacy Dickerson did have 4 line difference with DVA but Stacy Dickerson reported no symptoms upon completion of test and she was also able to perform x1 viewing exercise, both horizontal & vertical, without c/o  symptoms and no difficulty.  Stacy Dickerson does not appear to have problem with gaze stabilization; Stacy Dickerson does have progressive lenses which may impact DVA testing.  Stacy Dickerson declined x1 viewing ex. for HEP, stating she did not feel it was necessary.  Cont with POC.    Personal Factors and Comorbidities Age;Comorbidity 1;Comorbidity 2;Comorbidity 3+;Time since onset of injury/illness/exacerbation    Comorbidities hyperlipidemia, diabetes, hypertension, arthritis, OSA (no longer on CPAP), lupus    Examination-Activity Limitations Stairs;Carry;Squat   Not currently limited due to her dizziness (mostly limited due to her bilat knee OA and lupus); but becomes severely limited during one of her attacks   Examination-Participation Restrictions Community Activity   Not currently limited due to her dizziness (mostly limited due to her bilat knee OA and lupus); but becomes severely limited during one of her attacks for participation in any activity   Stability/Clinical Decision Making Stable/Uncomplicated    Rehab Potential Good    Stacy Dickerson Frequency 1x / week    Stacy Dickerson Duration Other (comment)   5 weeks   Stacy Dickerson Treatment/Interventions ADLs/Self Care Home Management;Gait training;Stair training;Functional mobility training;Therapeutic activities;Therapeutic exercise;Balance training;Neuromuscular re-education;Patient/family education;Manual techniques;Passive range of motion;Dry needling;Taping;Vestibular    Stacy Dickerson Next Visit Plan Recheck for Rt BPPV;  check balance on foam exs. (HEP) ; D/C next session? if no vertigo    Stacy Dickerson Home Exercise Plan Balance on foam - issued  05-30-20    Consulted and Agree with Plan of Care Patient           Patient will benefit from skilled therapeutic intervention in order to improve the following deficits and impairments:  Dizziness,Decreased mobility,Decreased balance,Decreased activity tolerance  Visit Diagnosis: Dizziness and giddiness  Unsteadiness on feet     Problem List Patient Active Problem List    Diagnosis Date Noted  . Chest pain of uncertain etiology 05/03/2020  . Obesity, diabetes, and hypertension syndrome (HCC) 05/03/2020  . Mixed hyperlipidemia 05/03/2020  . Systemic lupus erythematosus (HCC) 05/03/2020    Stacy Dickerson, Stacy Dickerson, Stacy Dickerson 05/31/2020, 12:33 PM  [facility] [address] [place], [place], [address] Phone: [phone]   Fax:  [phone]  Name: Stacy Dickerson MRN: [id number] Date of Birth: December 17, 1955

## 2020-06-06 ENCOUNTER — Ambulatory Visit: Payer: 59

## 2020-06-13 ENCOUNTER — Ambulatory Visit: Payer: 59 | Admitting: Physical Therapy

## 2020-06-13 ENCOUNTER — Other Ambulatory Visit: Payer: Self-pay

## 2020-06-13 VITALS — BP 107/57 | HR 91

## 2020-06-13 DIAGNOSIS — R42 Dizziness and giddiness: Secondary | ICD-10-CM

## 2020-06-14 NOTE — Therapy (Signed)
[facility] [address] [place], [place], [address] Phone: [phone]   Fax:  [phone]  Physical Therapy Treatment & Discharge Summary  Patient Details  Name: Stacy Dickerson MRN: [id number] Date of Birth: 05-Mar-1956 Referring Provider (PT): Star Age, MD   Encounter Date: 06/13/2020   PT End of Session - 06/14/20 1542    Visit Number 4    Number of Visits 6    Date for PT Re-Evaluation 06/19/20    Authorization Type [employer]    PT Start Time 0177    PT Stop Time 1623    PT Time Calculation (min) 48 min    Activity Tolerance Patient tolerated treatment well    Behavior During Therapy [facility] for tasks assessed/performed           Past Medical History:  Diagnosis Date  . Allergic rhinitis, unspecified   . Arthritis   . BMI 40.0-44.9, adult ([place])   . Chronic fatigue   . Diabetes mellitus without complication ([place])   . Diverticulitis   . Hyperlipidemia   . Hypertension   . IBS (irritable bowel syndrome)   . Lupus ([place])   . Major depressive disorder, recurrent episode, moderate ([place])   . Mild nonproliferative diabetic retinopathy of both eyes without macular edema ([facility])   . Morbid obesity ([place])   . SLE (systemic lupus erythematosus related syndrome) ([place])     Past Surgical History:  Procedure Laterality Date  . BREAST BIOPSY Left 2015   US guided    Vitals:   06/13/20 1540 06/13/20 1608  BP: 109/73 (!) 107/57  Pulse: 79 91     Subjective Assessment - 06/13/20 1540    Subjective Pt reports she has a headache on back of head on Lt side at this time - started this morning;    Pertinent History hyperlipidemia, diabetes, hypertension, arthritis, OSA (no longer on CPAP), lupus, and severe obesity with a BMI of over 40    Limitations Standing;House hold activities;Walking    How long can you sit comfortably? n/a    How long can you stand comfortably? n/a    How long can you walk comfortably? n/a     Diagnostic tests MRI ordered but not yet scheduled    Patient Stated Goals Improve dizziness    Currently in Pain? Yes    Pain Score 4     Pain Location Head    Pain Orientation Left    Pain Descriptors / Indicators Headache    Pain Type Acute pain    Pain Onset Today    Pain Frequency Constant                   Vestibular Assessment - 06/14/20 0001      Orthostatics   BP supine (x 5 minutes) 114/73    HR supine (x 5 minutes) 76    BP sitting 96/62    HR sitting 82    BP standing (after 1 minute) 99/60    HR standing (after 1 minute) 87                     Vestibular Treatment/Exercise - 06/14/20 0001      Vestibular Treatment/Exercise   Vestibular Treatment Provided Canalith Repositioning    Canalith Repositioning Epley Manuever Right       EPLEY MANUEVER RIGHT   Number of Reps  2    Overall Response No change    Response Details  very  questionable and mild nystagmus on both reps; pt denied having room spinning vertigo on either rep; reported some dizziness in 1st & 3rd position on 1st rep; no dizziness reported on 2nd rep of Epley                 PT Education - 06/14/20 1541    Education Details pt to follow up with her PCP regarding BP - some drop in BP noted with positional changes    Person(s) Educated Patient    Methods Explanation    Comprehension Verbalized understanding            PT Short Term Goals - 05/15/20 1611      PT SHORT TERM GOAL #1   Title STG = LTGs             PT Long Term Goals - 06/13/20 1623      PT LONG TERM GOAL #1   Title Pt will be independent with her HEP    Time 5    Period Weeks    Status Achieved      PT LONG TERM GOAL #2   Title Pt will have improved dynamic visual acuity to at least line 9 to demo improved VOR function    Baseline Line 8 on eval    Time 5    Period Weeks    Status Deferred      PT LONG TERM GOAL #3   Title Pt will have improved FGA to >/=26/30.    Baseline 23/30 on  eval    Time 5    Period Weeks    Status Deferred      PT LONG TERM GOAL #4   Title Pt will have improved FOTO score to at least 57 and improved DPS by at least 10 points    Baseline FOTO 57, DPS 56.4    Time 5    Period Weeks    Status Deferred                 Plan - 06/13/20 1542    Clinical Impression Statement Pt does not have signs or symptoms consistent with BPPV at this time; some questionable nystagmus was thought to have been observed in Rt Dix-Hallpike test position but was difficult to discern.  Pt reported "floaty feeling" and no true spinning vertigo on either rep.  Pt has drop in BP with positional changes, 9 point drop in diastolic BP with supine to sit.  Pt is discharged due to PT not beneficial at this time as pt does not appear to have BPPV but rather some orthostatic hypotension causing dizziness and feelings of dysequilibrium and "feeling floaty".  Pt requests to cancel final scheduled PT appt and follow up with her PCP to discuss BP.    Personal Factors and Comorbidities Age;Comorbidity 1;Comorbidity 2;Comorbidity 3+;Time since onset of injury/illness/exacerbation    Comorbidities hyperlipidemia, diabetes, hypertension, arthritis, OSA (no longer on CPAP), lupus    Examination-Activity Limitations Stairs;Carry;Squat   Not currently limited due to her dizziness (mostly limited due to her bilat knee OA and lupus); but becomes severely limited during one of her attacks   Examination-Participation Restrictions Community Activity   Not currently limited due to her dizziness (mostly limited due to her bilat knee OA and lupus); but becomes severely limited during one of her attacks for participation in any activity   Stability/Clinical Decision Making Stable/Uncomplicated    Rehab Potential Good    PT Frequency 1x / week  PT Duration Other (comment)   5 weeks   PT Treatment/Interventions ADLs/Self Care Home Management;Gait training;Stair training;Functional mobility  training;Therapeutic activities;Therapeutic exercise;Balance training;Neuromuscular re-education;Patient/family education;Manual techniques;Passive range of motion;Dry needling;Taping;Vestibular    PT Next Visit Plan D/C - 06-13-20    PT Home Exercise Plan Balance on foam - issued  05-30-20    Consulted and Agree with Plan of Care Patient           Patient will benefit from skilled therapeutic intervention in order to improve the following deficits and impairments:  Dizziness,Decreased mobility,Decreased balance,Decreased activity tolerance  Visit Diagnosis: Dizziness and giddiness     Problem List Patient Active Problem List   Diagnosis Date Noted  . Chest pain of uncertain etiology 37/07/8887  . Obesity, diabetes, and hypertension syndrome ([place]) 05/03/2020  . Mixed hyperlipidemia 05/03/2020  . Systemic lupus erythematosus ([place]) 05/03/2020      PHYSICAL THERAPY DISCHARGE SUMMARY  Visits from Start of Care:  4  Current functional level related to goals / functional outcomes: See above for progress towards goals - pt's symptoms are not consistent with BPPV at this time - appears to be related to BP   Remaining deficits: C/o headaches at times; c/o feeling "floaty" - pt continues to have nonspecific dizziness    Education / Equipment: Pt has been instructed in HEP consisting of vestibular exercises, including gaze stabilization Plan: Patient agrees to discharge.  Patient goals were partially met. Patient is being discharged due to lack of progress.  ?????   Pt is requesting D/C - wants to follow up with PCP regarding BP (orthostatic hypotension?)        Kimiye Strathman, [place], PT 06/14/2020, 3:49 PM  [facility] [address] [place], [place], [address] Phone: [phone]   Fax:  [phone]  Name: Stacy Dickerson MRN: [id number] Date of Birth: June 13, 1955

## 2020-06-20 ENCOUNTER — Ambulatory Visit: Payer: 59 | Admitting: Physical Therapy

## 2020-07-19 ENCOUNTER — Other Ambulatory Visit: Payer: Self-pay

## 2020-07-19 ENCOUNTER — Encounter: Payer: Self-pay | Admitting: Internal Medicine

## 2020-07-19 ENCOUNTER — Ambulatory Visit: Payer: 59 | Admitting: Internal Medicine

## 2020-07-19 DIAGNOSIS — E1169 Type 2 diabetes mellitus with other specified complication: Secondary | ICD-10-CM | POA: Diagnosis not present

## 2020-07-19 DIAGNOSIS — I951 Orthostatic hypotension: Secondary | ICD-10-CM | POA: Diagnosis not present

## 2020-07-19 DIAGNOSIS — E1159 Type 2 diabetes mellitus with other circulatory complications: Secondary | ICD-10-CM

## 2020-07-19 DIAGNOSIS — I152 Hypertension secondary to endocrine disorders: Secondary | ICD-10-CM

## 2020-07-19 DIAGNOSIS — R55 Syncope and collapse: Secondary | ICD-10-CM | POA: Insufficient documentation

## 2020-07-19 DIAGNOSIS — E669 Obesity, unspecified: Secondary | ICD-10-CM

## 2020-07-19 DIAGNOSIS — E782 Mixed hyperlipidemia: Secondary | ICD-10-CM

## 2020-07-19 NOTE — Patient Instructions (Signed)
Medication Instructions:  Your physician recommends that you continue on your current medications as directed. Please refer to the Current Medication list given to you today.  *If you need a refill on your cardiac medications before your next appointment, please call your pharmacy*   Lab Work: NONE If you have labs (blood work) drawn today and your tests are completely normal, you will receive your results only by: . MyChart Message (if you have MyChart) OR . A paper copy in the mail If you have any lab test that is abnormal or we need to change your treatment, we will call you to review the results.   Testing/Procedures: NONE   Follow-Up: At [facility], you and your health needs are our priority.  As part of our continuing mission to provide you with exceptional heart care, we have created designated Provider Care Teams.  These Care Teams include your primary Cardiologist (physician) and Advanced Practice Providers (APPs -  Physician Assistants and Nurse Practitioners) who all work together to provide you with the care you need, when you need it.  We recommend signing up for the patient portal called "MyChart".  Sign up information is provided on this After Visit Summary.  MyChart is used to connect with patients for Virtual Visits (Telemedicine).  Patients are able to view lab/test results, encounter notes, upcoming appointments, etc.  Non-urgent messages can be sent to your provider as well.   To learn more about what you can do with MyChart, go to [email or web address].    Your next appointment:   6 month(s)  The format for your next appointment:   In Person  Provider:   You may see Mahesh A Chandrasekhar, MD or one of the following Advanced Practice Providers on your designated Care Team:    Dayna Dunn, PA-C  Michele Lenze, PA-C       

## 2020-07-19 NOTE — Progress Notes (Signed)
Cardiology Office Note:    Date:  07/19/2020   ID:  Stacy Dickerson, DOB 07/02/1955, MRN [id number]  PCP:  Shirlean Mylar, MD  [facility] Cardiologist:  Christell Constant, MD  [facility] Electrophysiologist:  None   CC: Chest pain f/u  History of Present Illness:    Stacy Dickerson is a 65 y.o. female with a hx of Morbid Obesity, DM and HTN, HLD, SLE, OSA formerly on CPAP pending 05/20/20, orthostatic hypotension without presents for evaluation 05/03/20.  In interim of this visit, patient CCTA.  Patient notes that she is doing good.  Since last visit notes improved stress- mom is in Adult DayCare.  Relevant interval testing or therapy include Echo and CT Scan.  There are no interval hospital/ED visit.    No bad chest pains, still have a little bit of chest pain, but mch improved.  No SOB/DOE and no PND/Orthopnea.  No weight gain or leg swelling.  No palpitations or syncope .  Heart rate happened to be a bit high today.  Past Medical History:  Diagnosis Date  . Allergic rhinitis, unspecified   . Arthritis   . BMI 40.0-44.9, adult (HCC)   . Chronic fatigue   . Diabetes mellitus without complication (HCC)   . Diverticulitis   . Hyperlipidemia   . Hypertension   . IBS (irritable bowel syndrome)   . Lupus (HCC)   . Major depressive disorder, recurrent episode, moderate (HCC)   . Mild nonproliferative diabetic retinopathy of both eyes without macular edema (HCC)   . Morbid obesity (HCC)   . SLE (systemic lupus erythematosus related syndrome) (HCC)     Past Surgical History:  Procedure Laterality Date  . BREAST BIOPSY Left 2015   US guided    Current Medications: Current Meds  Medication Sig  . acetaminophen (TYLENOL) 500 MG tablet Take 500 mg by mouth every 6 (six) hours as needed.  . ALPRAZolam (XANAX) 0.25 MG tablet Take by mouth as needed. Panic attacks  . citalopram (CELEXA) 20 MG tablet Take 20 mg by mouth daily.  . Dulaglutide (TRULICITY) 1.5 MG/0.5ML SOPN Inject  into the skin.  . Empagliflozin-metFORMIN HCl (SYNJARDY) 12.08-998 MG TABS Take by mouth in the morning and at bedtime.  Marland Kitchen estradiol (ESTRACE) 0.1 MG/GM vaginal cream at bedtime.  . furosemide (LASIX) 40 MG tablet Take 40 mg by mouth daily.   Marland Kitchen glimepiride (AMARYL) 2 MG tablet Take 2 mg by mouth daily in the afternoon.  Marland Kitchen losartan (COZAAR) 100 MG tablet Take 100 mg by mouth every morning.   . meclizine (ANTIVERT) 25 MG tablet 3 (three) times daily as needed.  . meloxicam (MOBIC) 15 MG tablet Take by mouth daily.  . ondansetron (ZOFRAN) 8 MG tablet TAKE 1 TABLET BY MOUTH THREE TIMES DAILY FOR 5 DAYS AS NEEDED FOR NAUSEA  . potassium chloride (K-DUR) 10 MEQ tablet Take 10 mEq by mouth every morning.   . simvastatin (ZOCOR) 20 MG tablet Take 20 mg by mouth at bedtime.   Marland Kitchen VITAMIN D, CHOLECALCIFEROL, PO Take 5,000 Units by mouth.  . [DISCONTINUED] metoprolol tartrate (LOPRESSOR) 50 MG tablet Take one tablet by mouth 2 hours prior to cardiac CT Scan     Allergies:   Ciprofloxacin, Keflex [cephalexin], Lisinopril, and Sulfa antibiotics   Social History   Socioeconomic History  . Marital status: Single    Spouse name: Not on file  . Number of children: Not on file  . Years of education: Not on file  .  Highest education level: Not on file  Occupational History  . Not on file  Tobacco Use  . Smoking status: Never Smoker  . Smokeless tobacco: Never Used  Vaping Use  . Vaping Use: Never used  Substance and Sexual Activity  . Alcohol use: Yes    Alcohol/week: 1.0 standard drink    Types: 1 Standard drinks or equivalent per week  . Drug use: Never  . Sexual activity: Not on file  Other Topics Concern  . Not on file  Social History Narrative  . Not on file   Social Determinants of Health   Financial Resource Strain: Not on file  Food Insecurity: Not on file  Transportation Needs: Not on file  Physical Activity: Not on file  Stress: Not on file  Social Connections: Not on file      Family History: The patient's family history includes Breast cancer (age of onset: 38) in her mother.  History of coronary artery disease notable for no members. History of heart failure notable for aunt, mother. History of arrhythmia notable for no members. No history of bicuspid aortic valve or aortic aneurysm or dissection.  ROS:   Please see the history of present illness.     All other systems reviewed and are negative.  EKGs/Labs/Other Studies Reviewed:    The following studies were reviewed today:  EKG:   05/03/20: SR rate 74 WNL 03/31/2018:  SR rate 79 non-specific TWI    Transthoracic Echocardiogram: Date: 05/27/20 Results: 1. Left ventricular ejection fraction, by estimation, is 60 to 65%. The  left ventricle has normal function. The left ventricle has no regional  wall motion abnormalities. There is mild asymmetric left ventricular  hypertrophy of the basal and septal  segments. Left ventricular diastolic parameters are consistent with Grade  I diastolic dysfunction (impaired relaxation).  2. Right ventricular systolic function is normal. The right ventricular  size is normal.  3. Left atrial size was mildly dilated.  4. The mitral valve is abnormal. Trivial mitral valve regurgitation. No  evidence of mitral stenosis.  5. The aortic valve is tricuspid. Aortic valve regurgitation is not  visualized. Mild to moderate aortic valve sclerosis/calcification is  present, without any evidence of aortic stenosis.  6. The inferior vena cava is normal in size with greater than 50%  respiratory variability, suggesting right atrial pressure of 3 mmHg.   Cardiac CT: Date: 07/19/20 Results:  IMPRESSION: 1.Coronary calcium score of 0. This was 1st percentile for age, sex, and race matched control.  2. Normal coronary origin.  Right dominance.  3. CAD-RADS 0. No evidence of CAD (0%). Consider non-atherosclerotic causes of chest pain.   Recent Labs: 05/03/2020:  BUN 13; Creatinine, Ser 0.96; Potassium 4.2; Sodium 138  Recent Lipid Panel No results found for: CHOL, TRIG, HDL, CHOLHDL, VLDL, LDLCALC, LDLDIRECT  OSH Cholesterol labs: 03/11/2020 Total 158 HDL 58 LDL 78 TGs 125  Risk Assessment/Calculations:     N/A  Physical Exam:    VS:  BP 118/68   Pulse 91   Ht 5\' 7"  (1.702 m)   Wt 271 lb (122.9 kg)   SpO2 97%   BMI 42.44 kg/m     Wt Readings from Last 3 Encounters:  07/19/20 271 lb (122.9 kg)  05/03/20 276 lb (125.2 kg)  04/29/20 275 lb (124.7 kg)   GEN: Obese, well developed in no acute distress HEENT: Normal NECK: No JVD; No carotid bruits LYMPHATICS: No lymphadenopathy CARDIAC: RRR, no murmurs, rubs, gallops RESPIRATORY:  Clear to  auscultation without rales, wheezing or rhonchi  ABDOMEN: Soft, non-tender, non-distended MUSCULOSKELETAL:  No edema; No deformity  SKIN: Warm and dry NEUROLOGIC:  Alert and oriented x 3 PSYCHIATRIC:  Normal affect   ASSESSMENT:    1. Morbid obesity (HCC)   2. Obesity, diabetes, and hypertension syndrome (HCC)   3. Orthostatic hypotension   4. Vasovagal syncope   5. Mixed hyperlipidemia    PLAN:    In order of problems listed above:  Morbid Obesity/DM/HTN Orthostatic Hypotension Vasosvagal syncope - ambulatory blood pressure 120/80, will continue ambulatory BP monitoring; gave education on how to perform ambulatory blood pressure monitoring including the frequency and technique; goal ambulatory blood pressure < 140/85 on average (allowing slightly higher BP goal in the setting of syncope - discussed diet (DASH/low sodium), and exercise/weight loss interventions   Hyperlipidemia (mixed) -LDL goal less than 100 - Reviewed CT with Patient -continue current statin - gave education on dietary changes  6 month follow up unless new symptoms or abnormal test results warranting change in plan  Would be reasonable for  Video Visit Follow up Would be reasonable for  APP Follow  up    Medication Adjustments/Labs and Tests Ordered: Current medicines are reviewed at length with the patient today.  Concerns regarding medicines are outlined above.  No orders of the defined types were placed in this encounter.  No orders of the defined types were placed in this encounter.   Patient Instructions  Medication Instructions:  Your physician recommends that you continue on your current medications as directed. Please refer to the Current Medication list given to you today.  *If you need a refill on your cardiac medications before your next appointment, please call your pharmacy*   Lab Work: NONE If you have labs (blood work) drawn today and your tests are completely normal, you will receive your results only by: Marland Kitchen MyChart Message (if you have MyChart) OR . A paper copy in the mail If you have any lab test that is abnormal or we need to change your treatment, we will call you to review the results.   Testing/Procedures: NONE   Follow-Up: At [facility], you and your health needs are our priority.  As part of our continuing mission to provide you with exceptional heart care, we have created designated Provider Care Teams.  These Care Teams include your primary Cardiologist (physician) and Advanced Practice Providers (APPs -  Physician Assistants and Nurse Practitioners) who all work together to provide you with the care you need, when you need it.  We recommend signing up for the patient portal called "MyChart".  Sign up information is provided on this After Visit Summary.  MyChart is used to connect with patients for Virtual Visits (Telemedicine).  Patients are able to view lab/test results, encounter notes, upcoming appointments, etc.  Non-urgent messages can be sent to your provider as well.   To learn more about what you can do with MyChart, go to [email or web address].    Your next appointment:   6 month(s)  The format for your next appointment:   In  Person  Provider:   You may see Christell Constant, MD or one of the following Advanced Practice Providers on your designated Care Team:    Ronie Spies, PA-C  Jacolyn Reedy, PA-C          Signed, Christell Constant, MD  07/19/2020 3:48 PM    [facility]

## 2020-09-09 ENCOUNTER — Telehealth: Payer: Self-pay

## 2020-09-09 DIAGNOSIS — Z006 Encounter for examination for normal comparison and control in clinical research program: Secondary | ICD-10-CM

## 2020-09-09 NOTE — Telephone Encounter (Signed)
Called patient for 90 day Identify phone call pt stated she is not having anymore cardiac symptoms but did follow up with PCP, I reminded the patient that we would be calling her back around January for a year follow up phone call.  

## 2020-10-03 ENCOUNTER — Other Ambulatory Visit: Payer: Self-pay | Admitting: Family Medicine

## 2020-10-03 DIAGNOSIS — Z1231 Encounter for screening mammogram for malignant neoplasm of breast: Secondary | ICD-10-CM

## 2020-11-27 ENCOUNTER — Ambulatory Visit
Admission: RE | Admit: 2020-11-27 | Discharge: 2020-11-27 | Disposition: A | Discharge status: Home / Self Care | Payer: 59 | Source: Ambulatory Visit | Attending: Family Medicine | Admitting: Family Medicine

## 2020-11-27 ENCOUNTER — Other Ambulatory Visit: Payer: Self-pay

## 2020-11-27 DIAGNOSIS — Z1231 Encounter for screening mammogram for malignant neoplasm of breast: Secondary | ICD-10-CM

## 2021-11-12 ENCOUNTER — Other Ambulatory Visit: Payer: Self-pay | Admitting: Family Medicine

## 2021-11-12 DIAGNOSIS — Z1231 Encounter for screening mammogram for malignant neoplasm of breast: Secondary | ICD-10-CM

## 2021-11-28 ENCOUNTER — Ambulatory Visit
Admission: RE | Admit: 2021-11-28 | Discharge: 2021-11-28 | Disposition: A | Discharge status: Home / Self Care | Payer: 59 | Source: Ambulatory Visit | Attending: Family Medicine | Admitting: Family Medicine

## 2021-11-28 DIAGNOSIS — Z1231 Encounter for screening mammogram for malignant neoplasm of breast: Secondary | ICD-10-CM

## 2022-02-18 ENCOUNTER — Ambulatory Visit: Payer: 59 | Admitting: Podiatry

## 2022-02-18 ENCOUNTER — Ambulatory Visit (INDEPENDENT_AMBULATORY_CARE_PROVIDER_SITE_OTHER): Payer: 59

## 2022-02-18 ENCOUNTER — Encounter: Payer: Self-pay | Admitting: Podiatry

## 2022-02-18 DIAGNOSIS — M2041 Other hammer toe(s) (acquired), right foot: Secondary | ICD-10-CM

## 2022-02-18 DIAGNOSIS — M2042 Other hammer toe(s) (acquired), left foot: Secondary | ICD-10-CM

## 2022-02-19 NOTE — Progress Notes (Signed)
Subjective:   Patient ID: Stacy Dickerson, female   DOB: 66 y.o.   MRN: [id number]   HPI Patient states she gets pain in her right second toe over her left toe but states they both can bother her.  States that also she gets cramping and she feels like the toe is lifted in the air and gets aggravated.  Patient is moderately obese does not smoke is not active   Review of Systems  All other systems reviewed and are negative.       Objective:  Physical Exam Vitals and nursing note reviewed.  Constitutional:      Appearance: She is well-developed.  Pulmonary:     Effort: Pulmonary effort is normal.  Musculoskeletal:        General: Normal range of motion.  Skin:    General: Skin is warm.  Neurological:     Mental Status: She is alert.     Neurovascular status found to be intact muscle strength is found to be adequate range of motion is adequate.  Patient does have digital deformity digit to right over left with slight dorsal redness noted no active drainage noted no other pathology with pain     Assessment:  Digital deformity digit to right over left moderately tender but is able to accommodate with wide shoe gear     Plan:  H&P reviewed conditions discussed x-rays.  At this point I do think that cushioning would be best but I did discuss digital fusion but do not recommend currently unless symptoms were to get worse.  Patient will be seen back to recheck all questions were answered for her today  X-rays indicate there is structural deformity lesser digits to a moderate degree with mild arthritis no other pathology noted

## 2022-04-18 ENCOUNTER — Ambulatory Visit (HOSPITAL_COMMUNITY)
Admission: EM | Admit: 2022-04-18 | Discharge: 2022-04-18 | Disposition: A | Discharge status: Home / Self Care | Payer: 59 | Attending: Emergency Medicine | Admitting: Emergency Medicine

## 2022-04-18 ENCOUNTER — Encounter (HOSPITAL_COMMUNITY): Payer: Self-pay | Admitting: Emergency Medicine

## 2022-04-18 DIAGNOSIS — J069 Acute upper respiratory infection, unspecified: Secondary | ICD-10-CM | POA: Diagnosis not present

## 2022-04-18 DIAGNOSIS — Z1152 Encounter for screening for COVID-19: Secondary | ICD-10-CM

## 2022-04-18 LAB — POC INFLUENZA A AND B ANTIGEN (URGENT CARE ONLY)
INFLUENZA A ANTIGEN, POC: NEGATIVE
INFLUENZA B ANTIGEN, POC: NEGATIVE

## 2022-04-18 MED ORDER — BENZONATATE 100 MG PO CAPS: 100.0000 mg | ORAL_CAPSULE | Freq: Three times a day (TID) | ORAL | 0 refills | Status: DC | PRN

## 2022-04-18 MED ORDER — ALBUTEROL SULFATE HFA 108 (90 BASE) MCG/ACT IN AERS: 2.0000 | INHALATION_SPRAY | RESPIRATORY_TRACT | 0 refills | Status: DC | PRN

## 2022-04-18 NOTE — ED Provider Notes (Addendum)
[facility]   [id number] 04/18/22 Arrival Time: 1325   Chief Complaint  Patient presents with   Cough   Headache    SUBJECTIVE: History from: patient.  Stacy Dickerson is a 66 y.o. female who presented to the urgent care with a complaint of cough, wheezing and headache since Thursday.  Report she was exposed to COVID during recent travel.  Has not tried any OTC medication.  Denies any alleviating or aggravating factors. Denies previous symptoms in the past.   Denies fatigue, sinus pain, rhinorrhea, sore throat, SOB, wheezing, chest pain, nausea, changes in bowel or bladder habits.     ROS: As per HPI.  All other pertinent ROS negative.      Past Medical History:  Diagnosis Date   Allergic rhinitis, unspecified    Arthritis    BMI 40.0-44.9, adult (HCC)    Chronic fatigue    Diabetes mellitus without complication (HCC)    Diverticulitis    Hyperlipidemia    Hypertension    IBS (irritable bowel syndrome)    Lupus (HCC)    Major depressive disorder, recurrent episode, moderate (HCC)    Mild nonproliferative diabetic retinopathy of both eyes without macular edema (HCC)    Morbid obesity (HCC)    SLE (systemic lupus erythematosus related syndrome) (HCC)    Past Surgical History:  Procedure Laterality Date   BREAST BIOPSY Left 2015   US guided   Allergies  Allergen Reactions   Ciprofloxacin Itching    Skin gets red   Keflex [Cephalexin] Itching    Skin gets red   Lisinopril Cough    Pt stated, "gets a violent cough"   Sulfa Antibiotics Itching   No current facility-administered medications on file prior to encounter.   Current Outpatient Medications on File Prior to Encounter  Medication Sig Dispense Refill   hydroxychloroquine (PLAQUENIL) 200 MG tablet Take 200 mg by mouth 2 (two) times daily.     acetaminophen (TYLENOL) 500 MG tablet Take 500 mg by mouth every 6 (six) hours as needed.     Dulaglutide (TRULICITY) 1.5 MG/0.5ML SOPN Inject into the skin.      Empagliflozin-metFORMIN HCl (SYNJARDY) 12.08-998 MG TABS Take by mouth in the morning and at bedtime.     furosemide (LASIX) 40 MG tablet Take 40 mg by mouth daily.      meclizine (ANTIVERT) 25 MG tablet 3 (three) times daily as needed.     meloxicam (MOBIC) 15 MG tablet Take by mouth daily.     ondansetron (ZOFRAN) 8 MG tablet TAKE 1 TABLET BY MOUTH THREE TIMES DAILY FOR 5 DAYS AS NEEDED FOR NAUSEA     potassium chloride (K-DUR) 10 MEQ tablet Take 10 mEq by mouth every morning.      simvastatin (ZOCOR) 20 MG tablet Take 20 mg by mouth at bedtime.      VITAMIN D, CHOLECALCIFEROL, PO Take 5,000 Units by mouth.     Social History   Socioeconomic History   Marital status: Single    Spouse name: Not on file   Number of children: Not on file   Years of education: Not on file   Highest education level: Not on file  Occupational History   Not on file  Tobacco Use   Smoking status: Never   Smokeless tobacco: Never  Vaping Use   Vaping Use: Never used  Substance and Sexual Activity   Alcohol use: Yes    Alcohol/week: 1.0 standard drink of alcohol    Types:  1 Standard drinks or equivalent per week   Drug use: Never   Sexual activity: Not on file  Other Topics Concern   Not on file  Social History Narrative   Not on file   Social Determinants of Health   Financial Resource Strain: Not on file  Food Insecurity: Not on file  Transportation Needs: Not on file  Physical Activity: Not on file  Stress: Not on file  Social Connections: Not on file  Intimate Partner Violence: Not on file   Family History  Problem Relation Age of Onset   Breast cancer Mother 90    OBJECTIVE:  Vitals:   04/18/22 1532  BP: 115/79  Pulse: 83  Resp: 19  Temp: 100.3 F (37.9 C)  TempSrc: Oral  SpO2: 96%     General appearance: alert; appears fatigued, but nontoxic; speaking in full sentences and tolerating own secretions HEENT: NCAT; Ears: EACs clear, TMs pearly gray; Eyes: PERRL.  EOM  grossly intact. Sinuses: nontender; Nose: nares patent without rhinorrhea, Throat: oropharynx clear, tonsils non erythematous or enlarged, uvula midline  Neck: supple without LAD Lungs: unlabored respirations, symmetrical air entry; cough: moderate; no respiratory distress; CTAB Heart: regular rate and rhythm.  Radial pulses 2+ symmetrical bilaterally Skin: warm and dry Psychological: alert and cooperative; normal mood and affect  LABS:  Results for orders placed or performed during the hospital encounter of 04/18/22 (from the past 24 hour(s))  POC Influenza A & B Ag (Urgent Care)     Status: None   Collection Time: 04/18/22  4:09 PM  Result Value Ref Range   INFLUENZA A ANTIGEN, POC NEGATIVE NEGATIVE   INFLUENZA B ANTIGEN, POC NEGATIVE NEGATIVE     ASSESSMENT & PLAN:  1. URI with cough and congestion   2. Encounter for screening for COVID-19     Meds ordered this encounter  Medications   benzonatate (TESSALON) 100 MG capsule    Sig: Take 1 capsule (100 mg total) by mouth 3 (three) times daily as needed for cough.    Dispense:  30 capsule    Refill:  0   albuterol (VENTOLIN HFA) 108 (90 Base) MCG/ACT inhaler    Sig: Inhale 2 puffs into the lungs every 4 (four) hours as needed for wheezing or shortness of breath.    Dispense:  18 g    Refill:  0   If she tested positive for COVID she can be started on Paxlovid   Discharge instructions  Influenza test A/B is negative COVID test is pending and result should be available within 24 hours Get plenty of rest and push fluids Continue OTC ibuprofen or Tylenol as needed for fever Increase your fluid intake Tessalon Perles prescribed for cough ProAir was prescribed Use zyrtec for nasal congestion, runny nose, and/or sore throat Use flonase for nasal congestion and runny nose Use medications daily for symptom relief Use OTC medications like ibuprofen or tylenol as needed fever or pain Follow up with PCP in 1-2 days via phone or  e-visit for recheck and to ensure symptoms are improving Call or go to the ED if you have any new or worsening symptoms such as fever, worsening cough, shortness of breath, chest tightness, chest pain, turning blue, changes in mental status, etc...    Reviewed expectations re: course of current medical issues. Questions answered. Outlined signs and symptoms indicating need for more acute intervention. Patient verbalized understanding. After Visit Summary given.          Nena Jordan  S, FNP 04/18/22 1628    Durward Parcel, FNP 04/18/22 [phone]

## 2022-04-18 NOTE — ED Triage Notes (Addendum)
Thursday having cough, wheezing and headache. Traveled and got back on Wed, people with on trip have Covid. Reports had two negative covid home tests since Thursday. Hasn't taken medications for her symptoms

## 2022-04-18 NOTE — Discharge Instructions (Addendum)
Influenza test A/B is negative COVID test is pending and result should be available within 24 hours Get plenty of rest and push fluids Continue OTC ibuprofen or Tylenol as needed for fever Increase your fluid intake Tessalon Perles prescribed for cough ProAir was prescribed Use zyrtec for nasal congestion, runny nose, and/or sore throat Use flonase for nasal congestion and runny nose Use medications daily for symptom relief Use OTC medications like ibuprofen or tylenol as needed fever or pain Follow up with PCP in 1-2 days via phone or e-visit for recheck and to ensure symptoms are improving Call or go to the ED if you have any new or worsening symptoms such as fever, worsening cough, shortness of breath, chest tightness, chest pain, turning blue, changes in mental status, etc..Marland Kitchen

## 2022-04-19 LAB — SARS CORONAVIRUS 2 (TAT 6-24 HRS): SARS Coronavirus 2: NEGATIVE

## 2022-06-03 ENCOUNTER — Ambulatory Visit: Payer: 59 | Attending: Internal Medicine | Admitting: Internal Medicine

## 2022-06-03 ENCOUNTER — Encounter: Payer: Self-pay | Admitting: Internal Medicine

## 2022-06-03 DIAGNOSIS — I10 Essential (primary) hypertension: Secondary | ICD-10-CM | POA: Insufficient documentation

## 2022-06-03 DIAGNOSIS — M329 Systemic lupus erythematosus, unspecified: Secondary | ICD-10-CM

## 2022-06-03 DIAGNOSIS — E119 Type 2 diabetes mellitus without complications: Secondary | ICD-10-CM | POA: Diagnosis not present

## 2022-06-03 DIAGNOSIS — E782 Mixed hyperlipidemia: Secondary | ICD-10-CM

## 2022-06-03 NOTE — Progress Notes (Signed)
Cardiology Office Note:    Date:  06/03/2022   ID:  Stacy Dickerson, DOB 18-Jun-1955, MRN [id number]  PCP:  Jonathon Jordan, MD  [facility] Cardiologist:  Werner Lean, MD  [facility] Electrophysiologist:  None   CC: Chest pain f/u  History of Present Illness:    Stacy Dickerson is a 67 y.o. female with a hx of Morbid Obesity, DM and HTN, HLD, SLE, OSA formerly on CPAP pending 05/20/20, orthostatic hypotension without presents for evaluation 05/03/20.  In interim of this visit, patient CCTA.  Mother passed related to steroid issues.  This lead to depressions 2023: Had a Lupus Flare, on plaquenil.  Went to [place] right before Christmas and is now on inhaler 2024: Started Monjouaro.  She is having some issues with depth perception but had recent ophthalmology visit.  Patient notes that she is doing OK.   She has been dealing with new alopecia but no cardiac symptoms. There are no interval hospital/ED visit.    No chest pain or pressure .  No SOB/DOE and no PND/Orthopnea.  No weight gain or leg swelling.  No palpitations or syncope .   Past Medical History:  Diagnosis Date   Allergic rhinitis, unspecified    Arthritis    BMI 40.0-44.9, adult (HCC)    Chronic fatigue    Diabetes mellitus without complication (HCC)    Diverticulitis    Hyperlipidemia    Hypertension    IBS (irritable bowel syndrome)    Lupus ([place])    Major depressive disorder, recurrent episode, moderate (HCC)    Mild nonproliferative diabetic retinopathy of both eyes without macular edema (HCC)    Morbid obesity (HCC)    SLE (systemic lupus erythematosus related syndrome) ([place])     Past Surgical History:  Procedure Laterality Date   BREAST BIOPSY Left 2015   US guided    Current Medications: Current Meds  Medication Sig   acetaminophen (TYLENOL) 500 MG tablet Take 500 mg by mouth every 6 (six) hours as needed.   albuterol (VENTOLIN HFA) 108 (90 Base) MCG/ACT inhaler Inhale 2 puffs into the  lungs every 4 (four) hours as needed for wheezing or shortness of breath.   cyanocobalamin (VITAMIN B12) 1000 MCG tablet daily.   furosemide (LASIX) 40 MG tablet Take 40 mg by mouth daily.    hydroxychloroquine (PLAQUENIL) 200 MG tablet Take 200 mg by mouth 2 (two) times daily.   LANTUS SOLOSTAR 100 UNIT/ML Solostar Pen 10 Units daily.   meclizine (ANTIVERT) 25 MG tablet 3 (three) times daily as needed.   meloxicam (MOBIC) 15 MG tablet Take by mouth daily.   ondansetron (ZOFRAN) 8 MG tablet TAKE 1 TABLET BY MOUTH THREE TIMES DAILY FOR 5 DAYS AS NEEDED FOR NAUSEA   potassium chloride (K-DUR) 10 MEQ tablet Take 10 mEq by mouth every morning.    simvastatin (ZOCOR) 20 MG tablet Take 20 mg by mouth at bedtime.    SYNJARDY XR 25-1000 MG TB24 daily at 6 (six) AM.   tirzepatide (MOUNJARO) 2.5 MG/0.5ML Pen once a week.   valsartan (DIOVAN) 160 MG tablet daily.   VITAMIN D, CHOLECALCIFEROL, PO Take 5,000 Units by mouth.     Allergies:   Ciprofloxacin, Keflex [cephalexin], Lisinopril, and Sulfa antibiotics   Social History   Socioeconomic History   Marital status: Single    Spouse name: Not on file   Number of children: Not on file   Years of education: Not on file   Highest education  level: Not on file  Occupational History   Not on file  Tobacco Use   Smoking status: Never   Smokeless tobacco: Never  Vaping Use   Vaping Use: Never used  Substance and Sexual Activity   Alcohol use: Yes    Alcohol/week: 1.0 standard drink of alcohol    Types: 1 Standard drinks or equivalent per week   Drug use: Never   Sexual activity: Not on file  Other Topics Concern   Not on file  Social History Narrative   Not on file   Social Determinants of Health   Financial Resource Strain: Not on file  Food Insecurity: Not on file  Transportation Needs: Not on file  Physical Activity: Not on file  Stress: Not on file  Social Connections: Not on file    Social: mother has passed 08/09/20).  Family  History: The patient's family history includes Breast cancer (age of onset: 3) in her mother.  History of coronary artery disease notable for no members. History of heart failure notable for aunt, mother. History of arrhythmia notable for no members. No history of bicuspid aortic valve or aortic aneurysm or dissection.  ROS:   Please see the history of present illness.     All other systems reviewed and are negative.  EKGs/Labs/Other Studies Reviewed:    The following studies were reviewed today:  EKG:   06/03/22: SR reate 76 05/03/20: SR rate 74 WNL 03/31/2018:  SR rate 79 non-specific TWI    Cardiac Studies & Procedures       ECHOCARDIOGRAM  ECHOCARDIOGRAM COMPLETE 05/27/2020  Narrative ECHOCARDIOGRAM REPORT    Patient Name:   Stacy Dickerson Hallam Date of Exam: 05/27/2020 Medical Rec #:  [id number]     Height:       67.0 in Accession #:    [id number]    Weight:       276.0 lb Date of Birth:  25-Jun-1955     BSA:          2.319 m Patient Age:    75 years      BP:           120/74 mmHg Patient Gender: F             HR:           88 bpm. Exam Location:  [place]  Procedure: 2D Echo, Cardiac Doppler and Color Doppler  Indications:    R06.00 Dyspnea R07.9 Chest pain  History:        Patient has no prior history of Echocardiogram examinations. Risk Factors:Morbid Obesity, Hypertension, Dyslipidemia and Diabetes. Obstructive sleep apnea/.  Sonographer:    NaTashia Rodgers-Jones RDCS Referring Phys: [id number] [facility] A Shyleigh Daughtry  IMPRESSIONS   1. Left ventricular ejection fraction, by estimation, is 60 to 65%. The left ventricle has normal function. The left ventricle has no regional wall motion abnormalities. There is mild asymmetric left ventricular hypertrophy of the basal and septal segments. Left ventricular diastolic parameters are consistent with Grade I diastolic dysfunction (impaired relaxation). 2. Right ventricular systolic function is normal. The right  ventricular size is normal. 3. Left atrial size was mildly dilated. 4. The mitral valve is abnormal. Trivial mitral valve regurgitation. No evidence of mitral stenosis. 5. The aortic valve is tricuspid. Aortic valve regurgitation is not visualized. Mild to moderate aortic valve sclerosis/calcification is present, without any evidence of aortic stenosis. 6. The inferior vena cava is normal in size with greater than 50% respiratory variability, suggesting right  atrial pressure of 3 mmHg.  FINDINGS Left Ventricle: Left ventricular ejection fraction, by estimation, is 60 to 65%. The left ventricle has normal function. The left ventricle has no regional wall motion abnormalities. The left ventricular internal cavity size was normal in size. There is mild asymmetric left ventricular hypertrophy of the basal and septal segments. Left ventricular diastolic parameters are consistent with Grade I diastolic dysfunction (impaired relaxation).  Right Ventricle: The right ventricular size is normal. No increase in right ventricular wall thickness. Right ventricular systolic function is normal.  Left Atrium: Left atrial size was mildly dilated.  Right Atrium: Right atrial size was normal in size.  Pericardium: There is no evidence of pericardial effusion.  Mitral Valve: The mitral valve is abnormal. There is mild thickening of the mitral valve leaflet(s). There is mild calcification of the mitral valve leaflet(s). Mild mitral annular calcification. Trivial mitral valve regurgitation. No evidence of mitral valve stenosis.  Tricuspid Valve: The tricuspid valve is normal in structure. Tricuspid valve regurgitation is not demonstrated. No evidence of tricuspid stenosis.  Aortic Valve: The aortic valve is tricuspid. Aortic valve regurgitation is not visualized. Mild to moderate aortic valve sclerosis/calcification is present, without any evidence of aortic stenosis.  Pulmonic Valve: The pulmonic valve was  normal in structure. Pulmonic valve regurgitation is not visualized. No evidence of pulmonic stenosis.  Aorta: The aortic root is normal in size and structure.  Venous: The inferior vena cava is normal in size with greater than 50% respiratory variability, suggesting right atrial pressure of 3 mmHg.  IAS/Shunts: No atrial level shunt detected by color flow Doppler.   LEFT VENTRICLE PLAX 2D LVIDd:         3.20 cm  Diastology LVIDs:         1.90 cm  LV e' medial:    6.09 cm/s LV PW:         0.80 cm  LV E/e' medial:  11.1 LV IVS:        0.80 cm  LV e' lateral:   8.05 cm/s LVOT diam:     2.10 cm  LV E/e' lateral: 8.4 LV SV:         66 LV SV Index:   29 LVOT Area:     3.46 cm   RIGHT VENTRICLE RV Basal diam:  3.30 cm RV S prime:     16.30 cm/s TAPSE (M-mode): 2.0 cm  LEFT ATRIUM             Index       RIGHT ATRIUM          Index LA diam:        3.80 cm 1.64 cm/m  RA Area:     8.91 cm LA Vol (A2C):   24.6 ml 10.61 ml/m RA Volume:   18.90 ml 8.15 ml/m LA Vol (A4C):   15.2 ml 6.55 ml/m LA Biplane Vol: 20.5 ml 8.84 ml/m AORTIC VALVE LVOT Vmax:   98.90 cm/s LVOT Vmean:  76.300 cm/s LVOT VTI:    0.191 m  AORTA Ao Root diam: 2.90 cm Ao Asc diam:  3.30 cm  MITRAL VALVE MV Area (PHT): 3.72 cm     SHUNTS MV Decel Time: 204 msec     Systemic VTI:  0.19 m MV E velocity: 67.40 cm/s   Systemic Diam: 2.10 cm MV A velocity: 105.00 cm/s MV E/A ratio:  0.64  Charlton Haws MD Electronically signed by Charlton Haws MD Signature Date/Time: 05/27/2020/1:28:21 PM  Final     CT SCANS  CT CORONARY MORPH W/CTA COR W/SCORE 06/03/2020  Addendum 06/03/2020  8:18 AM ADDENDUM REPORT: 06/03/2020 08:16  EXAM: OVER-READ INTERPRETATION  CT CHEST  The following report is an over-read performed by radiologist Dr. Samara Snide [facility], [place] on 06/03/2020. This over-read does not include interpretation of cardiac or coronary anatomy or pathology. The coronary CTA interpretation  by the cardiologist is attached.  COMPARISON:  03/30/2018 chest CT angiogram.  FINDINGS: Cardiovascular: Normal heart size. No significant pericardial effusion/thickening. Great vessels are normal in course and caliber. No central pulmonary emboli.  Mediastinum/Nodes: Unremarkable esophagus. No pathologically enlarged mediastinal or hilar lymph nodes.  Lungs/Pleura: No pneumothorax. No pleural effusion. No acute consolidative airspace disease, lung masses or significant pulmonary nodules.  Upper abdomen: No acute abnormality.  Musculoskeletal: No aggressive appearing focal osseous lesions. Moderate thoracic spondylosis.  IMPRESSION: No significant extracardiac findings.   Electronically Signed By: Ilona Sorrel M.D. On: 06/03/2020 08:16  Narrative CLINICAL DATA:  67 Year old African American female  EXAM: Cardiac/Coronary  CTA  TECHNIQUE: The patient was scanned on a [employer].  FINDINGS: A 100 kV prospective scan was triggered in the descending thoracic aorta at 111 HU's. Axial non-contrast 3 mm slices were carried out through the heart. The data set was analyzed on a dedicated work station and scored using the [place]. Gantry rotation speed was 250 msecs and collimation was .6 mm. No beta blockade and 0.8 mg of sl NTG was given. The 3D data set was reconstructed in 5% intervals of the 67-82 % of the R-R cycle. Diastolic phases were analyzed on a dedicated work station using MPR, MIP and VRT modes. The patient received 80 cc of contrast.  Aorta:  Normal size.  No calcifications.  No dissection.  Aortic Valve:  Tri-leaflet.  No calcifications.  Coronary Arteries:  Normal coronary origin.  Right dominance.  Coronary calcium score of 0. This was 1st percentile for age, sex, and race matched control.  RCA is a large dominant artery that gives rise to PDA and PLA. There is no plaque.  Left main is a large artery that gives rise to LAD and  LCX arteries. There is no plaque.  LAD is a large vessel that gives rise to a small D1 vessel. There is no plaque.  LCX is a non-dominant artery that gives rise to one tortuous OM1 branch. There is no plaque.  Other findings:  Normal pulmonary vein drainage into the left atrium.  Normal left atrial appendage without a thrombus.  Normal size of the pulmonary artery.  Incidental left atrial diverticulum noted.  Extra-cardiac findings: See attached radiology report for non-cardiac structures.  IMPRESSION: 1.Coronary calcium score of 0. This was 1st percentile for age, sex, and race matched control.  2. Normal coronary origin.  Right dominance.  3. CAD-RADS 0. No evidence of CAD (0%). Consider non-atherosclerotic causes of chest pain.  Electronically Signed: By: Rudean Haskell MD On: 05/31/2020 17:09           Recent Labs: No results found for requested labs within last 365 days.  Recent Lipid Panel No results found for: "CHOL", "TRIG", "HDL", "CHOLHDL", "VLDL", "LDLCALC", "LDLDIRECT"   Physical Exam:    VS:  BP 132/68   Pulse 76   Ht 5\' 7"  (1.702 m)   Wt 265 lb (120.2 kg)   SpO2 95%   BMI 41.50 kg/m     Wt Readings from Last 3 Encounters:  06/03/22 265 lb (120.2 kg)  07/19/20 271 lb (122.9 kg)  05/03/20 276 lb (125.2 kg)   GEN: Obese, well developed in no acute distress HEENT: Normal NECK: No JVD; No carotid bruits LYMPHATICS: No lymphadenopathy CARDIAC: RRR, no murmurs, rubs, gallops RESPIRATORY:  Clear to auscultation without rales, wheezing or rhonchi  ABDOMEN: Soft, non-tender, non-distended MUSCULOSKELETAL:  No edema; No deformity  SKIN: Warm and dry NEUROLOGIC:  Alert and oriented x 3 PSYCHIATRIC:  Normal affect   ASSESSMENT:    1. Morbid obesity ([place])   2. Diabetes mellitus with coincident hypertension ([place])   3. Systemic lupus erythematosus, unspecified SLE type, unspecified organ involvement status ([place])   4. Mixed  hyperlipidemia     PLAN:     Morbid Obesity/DM/HTN - continue current medications  Hyperlipidemia (mixed) -LDL goal less than 100 -continue current statin  SLE - no evidence of PH; low threshold to repeat echo  One year or PRN if patient prefers    Medication Adjustments/Labs and Tests Ordered: Current medicines are reviewed at length with the patient today.  Concerns regarding medicines are outlined above.  No orders of the defined types were placed in this encounter.  No orders of the defined types were placed in this encounter.   Patient Instructions  Medication Instructions:  Your physician recommends that you continue on your current medications as directed. Please refer to the Current Medication list given to you today.  *If you need a refill on your cardiac medications before your next appointment, please call your pharmacy*   Lab Work: NONE If you have labs (blood work) drawn today and your tests are completely normal, you will receive your results only by: [place] (if you have MyChart) OR A paper copy in the mail If you have any lab test that is abnormal or we need to change your treatment, we will call you to review the results.   Testing/Procedures: NONE   Follow-Up: At [facility], you and your health needs are our priority.  As part of our continuing mission to provide you with exceptional heart care, we have created designated Provider Care Teams.  These Care Teams include your primary Cardiologist (physician) and Advanced Practice Providers (APPs -  Physician Assistants and Nurse Practitioners) who all work together to provide you with the care you need, when you need it.   Your next appointment:   1 year(s)  Provider:   Werner Lean, MD       Signed, Werner Lean, MD  06/03/2022 4:59 PM    [place]

## 2022-06-03 NOTE — Patient Instructions (Signed)
Medication Instructions:  Your physician recommends that you continue on your current medications as directed. Please refer to the Current Medication list given to you today.  *If you need a refill on your cardiac medications before your next appointment, please call your pharmacy*   Lab Work: NONE If you have labs (blood work) drawn today and your tests are completely normal, you will receive your results only by: MyChart Message (if you have MyChart) OR A paper copy in the mail If you have any lab test that is abnormal or we need to change your treatment, we will call you to review the results.   Testing/Procedures: NONE   Follow-Up: At [facility], you and your health needs are our priority.  As part of our continuing mission to provide you with exceptional heart care, we have created designated Provider Care Teams.  These Care Teams include your primary Cardiologist (physician) and Advanced Practice Providers (APPs -  Physician Assistants and Nurse Practitioners) who all work together to provide you with the care you need, when you need it.     Your next appointment:   1 year(s)  Provider:   Mahesh A Chandrasekhar, MD     

## 2022-06-05 NOTE — Addendum Note (Signed)
Addended by: Janan Halter F on: 06/05/2022 04:25 PM   Modules accepted: Orders

## 2022-06-23 ENCOUNTER — Ambulatory Visit
Admission: RE | Admit: 2022-06-23 | Discharge: 2022-06-23 | Disposition: A | Discharge status: Home / Self Care | Payer: 59 | Source: Ambulatory Visit | Attending: Family Medicine | Admitting: Family Medicine

## 2022-06-23 ENCOUNTER — Other Ambulatory Visit: Payer: Self-pay | Admitting: Family Medicine

## 2022-06-23 DIAGNOSIS — R053 Chronic cough: Secondary | ICD-10-CM

## 2022-08-05 LAB — LAB REPORT - SCANNED: EGFR: 46

## 2023-01-11 ENCOUNTER — Other Ambulatory Visit: Payer: Self-pay | Admitting: Family Medicine

## 2023-01-11 DIAGNOSIS — Z1231 Encounter for screening mammogram for malignant neoplasm of breast: Secondary | ICD-10-CM

## 2023-01-12 ENCOUNTER — Other Ambulatory Visit: Payer: Self-pay | Admitting: Family Medicine

## 2023-01-12 DIAGNOSIS — E2839 Other primary ovarian failure: Secondary | ICD-10-CM

## 2023-01-13 ENCOUNTER — Ambulatory Visit
Admission: RE | Admit: 2023-01-13 | Discharge: 2023-01-13 | Disposition: A | Discharge status: Home / Self Care | Payer: 59 | Source: Ambulatory Visit | Attending: Family Medicine | Admitting: Family Medicine

## 2023-01-13 DIAGNOSIS — Z1231 Encounter for screening mammogram for malignant neoplasm of breast: Secondary | ICD-10-CM

## 2023-02-12 NOTE — Progress Notes (Signed)
Office Visit Note  Patient: Stacy Dickerson             Date of Birth: 08/18/1955           MRN: [id number]             PCP: Mila Palmer, MD Referring: Mila Palmer, MD Visit Date: 02/26/2023 Occupation: @GUAROCC @  Subjective:  Pain in joints and hair loss  History of Present Illness: Stacy Dickerson is a 67 y.o. female seen in consultation per request of her PCP. Patient was initially seen by me in 2005.  She had known diagnosis of systemic lupus since 1990.  Initially she was treated with NSAIDs by her PCP.  She was later evaluated by a rheumatologist in [place] and was given the diagnosis of lupus.  She was given prednisone which she took for 3 months and then discontinued due to peptic ulcer per patient.  At her initial visit in 2005 she was having knee joint pain and x-ray showed moderate to severe osteoarthritis of the left knee and moderate osteoarthritis of the right knee. Her last visit was in 2017 at the time she had the diagnosis of osteoarthritis of the knee joints, right fourth Dupuytren's contracture and left medial epicondylitis.  Her left medial epicondyle region was injected with the cortisone.  She lost follow-up after that. Patient states 2 years ago she was under a lot of stress as her mother was diagnosed with lung cancer and Alzheimer's.  She was her sole caregiver.  She had to go on antidepressants.  She also started losing hair.  After her mother passed her hair loss persist.  She was evaluated by Dr. Melida Quitter. She had a skin biopsy by Dr. Melida Quitter.  I reviewed the notes from August 04, 2022 [facility] dermatology Dr. Mickel Crow skin biopsy was done for atrophic plaques and a scarring alopecia.  He gave her the diagnosis of discoid lupus.  He placed her on hydroxychloroquine in October 2023.  She has been taking hydroxychloroquine 200 mg p.o. twice daily on a daily basis.  Patient states that she has not noticed any improvement in the hair thinning.  She feels that  her hair thinning is getting worse.  She states that she used to get a rash on her extremities and her ears while she was living in [place] many years ago.  She has had no rash on her trunk or extremities since she has moved to [place] several years ago.  She gives history of fatigue, dry mouth and dry eyes.  There is no history of oral ulcers, malar rash, Raynaud's phenomenon or lymphadenopathy.  She gives history of photosensitivity.  History of some discomfort in her knee joints.  She has difficulty descending.  She denies any history of joint swelling. There is no family history of systemic lupus or autoimmune disease.  She is gravida 0.  She has 1 adopted child.  There is  no history of DVTs.   Activities of Daily Living:  Patient reports morning stiffness for 10 minutes.   Patient Reports nocturnal pain.  Difficulty dressing/grooming: Denies Difficulty climbing stairs: Denies Difficulty getting out of chair: Reports Difficulty using hands for taps, buttons, cutlery, and/or writing: Denies  Review of Systems  Constitutional:  Positive for fatigue.  HENT:  Positive for mouth dryness. Negative for mouth sores.   Eyes:  Positive for dryness.  Respiratory:  Negative for shortness of breath.   Cardiovascular:  Positive for swelling in legs/feet. Negative for  chest pain and palpitations.  Gastrointestinal:  Positive for diarrhea. Negative for blood in stool and constipation.  Endocrine: Negative for increased urination.  Genitourinary:  Negative for involuntary urination.  Musculoskeletal:  Positive for joint pain, joint pain and morning stiffness. Negative for gait problem, joint swelling, myalgias, muscle weakness, muscle tenderness and myalgias.  Skin:  Positive for hair loss and sensitivity to sunlight. Negative for color change and rash.  Allergic/Immunologic: Negative for susceptible to infections.  Neurological:  Positive for dizziness and light-headedness. Negative for  headaches.  Hematological:  Negative for swollen glands.  Psychiatric/Behavioral:  Negative for depressed mood and sleep disturbance. The patient is not nervous/anxious.     PMFS History:  Patient Active Problem List   Diagnosis Date Noted   Diabetes mellitus with coincident hypertension (HCC) 06/03/2022   Mixed hyperlipidemia 05/03/2020   Systemic lupus erythematosus (HCC) 05/03/2020    Past Medical History:  Diagnosis Date   Allergic rhinitis, unspecified    Arthritis    BMI 40.0-44.9, adult (HCC)    Chronic fatigue    Diabetes mellitus without complication (HCC)    Diverticulitis    Hyperlipidemia    Hypertension    IBS (irritable bowel syndrome)    Lupus    Major depressive disorder, recurrent episode, moderate (HCC)    Mild nonproliferative diabetic retinopathy of both eyes without macular edema (HCC)    Morbid obesity (HCC)    SLE (systemic lupus erythematosus related syndrome) (HCC)     Family History  Problem Relation Age of Onset   Breast cancer Mother 65   Cancer - Lung Mother    Alzheimer's disease Mother    Diabetes Mother    Hypertension Mother    Throat cancer Father    Prostate cancer Father    Past Surgical History:  Procedure Laterality Date   BREAST BIOPSY Left 2015   US guided   CHOLECYSTECTOMY     DIAGNOSTIC LAPAROSCOPY     MYOMECTOMY     TONSILLECTOMY     Social History   Social History Narrative   Not on file    There is no immunization history on file for this patient.   Objective: Vital Signs: BP 128/80 (BP Location: Right Arm, Patient Position: Sitting, Cuff Size: Large)   Pulse 73   Resp 14   Ht 5\' 6"  (1.676 m)   Wt 239 lb 12.8 oz (108.8 kg)   BMI 38.70 kg/m    Physical Exam Vitals and nursing note reviewed.  Constitutional:      Appearance: She is well-developed.  HENT:     Head: Normocephalic and atraumatic.  Eyes:     Conjunctiva/sclera: Conjunctivae normal.  Cardiovascular:     Rate and Rhythm: Normal rate and  regular rhythm.     Heart sounds: Normal heart sounds.  Pulmonary:     Effort: Pulmonary effort is normal.     Breath sounds: Normal breath sounds.  Abdominal:     General: Bowel sounds are normal.     Palpations: Abdomen is soft.  Musculoskeletal:     Cervical back: Normal range of motion.  Lymphadenopathy:     Cervical: No cervical adenopathy.  Skin:    General: Skin is warm and dry.     Capillary Refill: Capillary refill takes less than 2 seconds.     Comments: No active lesions were noted on the scalp.  Hair thinning was noted on the crown of her head.  Neurological:     Mental Status: She is  alert and oriented to person, place, and time.  Psychiatric:        Behavior: Behavior normal.      Musculoskeletal Exam: Cervical, thoracic and lumbar spine with good range of motion without any discomfort.  Shoulder joints, elbow joints, wrist joints, MCPs PIPs and DIPs with good range of motion.  She had her right fourth finger Dupuytren's contracture.  No synovitis was noted.  Hip joints and knee joints in good range of motion.  She discomfort range of motion of bilateral knee joints without any warmth swelling or effusion.  There was no tenderness over ankles or MTPs.  CDAI Exam: CDAI Score: -- Patient Global: --; Provider Global: -- Swollen: --; Tender: -- Joint Exam 02/26/2023   No joint exam has been documented for this visit   There is currently no information documented on the homunculus. Go to the Rheumatology activity and complete the homunculus joint exam.  Investigation: No additional findings.  Imaging: No results found.  Recent Labs: Lab Results  Component Value Date   WBC 8.1 03/30/2018   HGB 15.6 (H) 03/30/2018   PLT 313 03/30/2018   NA 138 05/03/2020   K 4.2 05/03/2020   CL 101 05/03/2020   CO2 22 05/03/2020   GLUCOSE 114 (H) 05/03/2020   BUN 13 05/03/2020   CREATININE 0.96 05/03/2020   BILITOT 0.5 03/30/2018   ALKPHOS 92 03/30/2018   AST 26  03/30/2018   ALT 26 03/30/2018   PROT 8.2 (H) 03/30/2018   ALBUMIN 4.4 03/30/2018   CALCIUM 9.9 05/03/2020   GFRAA 71 05/03/2020    January 06, 2022 skin biopsy diagnosis: Features consistent with cutaneous lupus erythematosus per pathology report.  July 19, 2014 ESR 11, ACE 66, C3-C4 normal, ANA negative, toxo antibodies negative May 30, 2010 ANA negative  Speciality Comments: No specialty comments available.  Procedures:  No procedures performed Allergies: Ciprofloxacin, Keflex [cephalexin], Lisinopril, and Sulfa antibiotics   Assessment / Plan:     Visit Diagnoses: Cutaneous lupus erythematosus - Skin biopsy January 06, 2022 suggestive of cutaneous lupus, ANA  negative2012 and 2016 -patient gives history of sicca symptoms, photosensitivity fatigue and arthralgias.  She denies any history of oral ulcers, nasal ulcers, malar rash, lymphadenopathy or inflammatory arthritis.  I will obtain labs today.  Plan: CBC with Differential/Platelet, COMPLETE METABOLIC PANEL WITH GFR, Sedimentation rate, ANA, RNP Antibody, Anti-Smith antibody, Sjogrens syndrome-A extractable nuclear antibody, Sjogrens syndrome-B extractable nuclear antibody, Anti-DNA antibody, double-stranded, C3 and C4  Hair loss-she has not here thinning on the crown of her head.  She had no active lesions.  She is currently on hydroxychloroquine.  Use of biotin and Rogaine 5% over-the-counter was discussed.  She may also benefit from finasteride.  Patient has an appointment coming up at [facility] in 1 year.  High risk medication use - On Plaquenil by Dr. Melida Quitter for discoid lupus.  Patient states she started hydroxychloroquine 200 mg p.o. twice daily in October 2023.  She has been getting eye exam with the ophthalmologist twice a year.  Primary osteoarthritis of both knees -she has known history of osteoarthritis in her knee joints.  She denies any swelling.  She has some discomfort with walking and also  descending.  Plan: XR KNEE 3 VIEW RIGHT, XR KNEE 3 VIEW LEFT.  X-ray showed bilateral moderate osteoarthritis and chondromalacia patella.  Dupuytren's contracture of right hand-the right ring finger Dupuytren's contracture was noted which is not bothersome for patient.  Other fatigue -he has been experiencing  fatigue.  I will obtain following labs.  Plan: CK, TSH, Vitamin B12  Diabetes mellitus with coincident hypertension (HCC)-followed at [facility].  Mixed hyperlipidemia-she is on statins.  Other irritable bowel syndrome  Vitamin D deficiency - Plan: VITAMIN D 25 Hydroxy (Vit-D Deficiency, Fractures)  Anxiety and depression  Morbid (severe) obesity due to excess calories (HCC)  Primary hypertension-blood pressure was normal today at 128/80.  Orders: Orders Placed This Encounter  Procedures   XR KNEE 3 VIEW RIGHT   XR KNEE 3 VIEW LEFT   CBC with Differential/Platelet   COMPLETE METABOLIC PANEL WITH GFR   Sedimentation rate   CK   TSH   VITAMIN D 25 Hydroxy (Vit-D Deficiency, Fractures)   ANA   RNP Antibody   Anti-Smith antibody   Sjogrens syndrome-A extractable nuclear antibody   Sjogrens syndrome-B extractable nuclear antibody   Anti-DNA antibody, double-stranded   C3 and C4   Vitamin B12   No orders of the defined types were placed in this encounter.    Follow-Up Instructions: Return for DLE.   Pollyann Savoy, MD  Note - This record has been created using Animal nutritionist.  Chart creation errors have been sought, but may not always  have been located. Such creation errors do not reflect on  the standard of medical care.

## 2023-02-26 ENCOUNTER — Ambulatory Visit: Payer: 59 | Attending: Rheumatology | Admitting: Rheumatology

## 2023-02-26 ENCOUNTER — Ambulatory Visit (INDEPENDENT_AMBULATORY_CARE_PROVIDER_SITE_OTHER): Payer: 59

## 2023-02-26 ENCOUNTER — Ambulatory Visit: Payer: 59

## 2023-02-26 ENCOUNTER — Encounter: Payer: Self-pay | Admitting: Rheumatology

## 2023-02-26 VITALS — BP 128/80 | HR 73 | Resp 14 | Ht 66.0 in | Wt 239.8 lb

## 2023-02-26 DIAGNOSIS — E782 Mixed hyperlipidemia: Secondary | ICD-10-CM

## 2023-02-26 DIAGNOSIS — M170 Bilateral primary osteoarthritis of knee: Secondary | ICD-10-CM | POA: Diagnosis not present

## 2023-02-26 DIAGNOSIS — F419 Anxiety disorder, unspecified: Secondary | ICD-10-CM

## 2023-02-26 DIAGNOSIS — L932 Other local lupus erythematosus: Secondary | ICD-10-CM | POA: Diagnosis not present

## 2023-02-26 DIAGNOSIS — R5383 Other fatigue: Secondary | ICD-10-CM

## 2023-02-26 DIAGNOSIS — K588 Other irritable bowel syndrome: Secondary | ICD-10-CM

## 2023-02-26 DIAGNOSIS — M720 Palmar fascial fibromatosis [Dupuytren]: Secondary | ICD-10-CM

## 2023-02-26 DIAGNOSIS — L659 Nonscarring hair loss, unspecified: Secondary | ICD-10-CM

## 2023-02-26 DIAGNOSIS — Z79899 Other long term (current) drug therapy: Secondary | ICD-10-CM | POA: Diagnosis not present

## 2023-02-26 DIAGNOSIS — F32A Depression, unspecified: Secondary | ICD-10-CM

## 2023-02-26 DIAGNOSIS — L930 Discoid lupus erythematosus: Secondary | ICD-10-CM

## 2023-02-26 DIAGNOSIS — E559 Vitamin D deficiency, unspecified: Secondary | ICD-10-CM

## 2023-02-26 DIAGNOSIS — M3219 Other organ or system involvement in systemic lupus erythematosus: Secondary | ICD-10-CM

## 2023-02-26 DIAGNOSIS — I10 Essential (primary) hypertension: Secondary | ICD-10-CM

## 2023-02-26 DIAGNOSIS — E119 Type 2 diabetes mellitus without complications: Secondary | ICD-10-CM

## 2023-03-01 LAB — CBC WITH DIFFERENTIAL/PLATELET
Absolute Lymphocytes: 1284 {cells}/uL (ref 850–3900)
Absolute Monocytes: 322 {cells}/uL (ref 200–950)
Basophils Absolute: 62 {cells}/uL (ref 0–200)
Basophils Relative: 1.2 %
Eosinophils Absolute: 99 {cells}/uL (ref 15–500)
Eosinophils Relative: 1.9 %
HCT: 42.1 % (ref 35.0–45.0)
Hemoglobin: 13.2 g/dL (ref 11.7–15.5)
MCH: 26.9 pg — ABNORMAL LOW (ref 27.0–33.0)
MCHC: 31.4 g/dL — ABNORMAL LOW (ref 32.0–36.0)
MCV: 85.9 fL (ref 80.0–100.0)
MPV: 10.4 fL (ref 7.5–12.5)
Monocytes Relative: 6.2 %
Neutro Abs: 3432 {cells}/uL (ref 1500–7800)
Neutrophils Relative %: 66 %
Platelets: 276 10*3/uL (ref 140–400)
RBC: 4.9 10*6/uL (ref 3.80–5.10)
RDW: 13.2 % (ref 11.0–15.0)
Total Lymphocyte: 24.7 %
WBC: 5.2 10*3/uL (ref 3.8–10.8)

## 2023-03-01 LAB — SEDIMENTATION RATE: Sed Rate: 6 mm/h (ref 0–30)

## 2023-03-01 LAB — COMPLETE METABOLIC PANEL WITH GFR
AG Ratio: 1.7 (calc) (ref 1.0–2.5)
ALT: 20 U/L (ref 6–29)
AST: 20 U/L (ref 10–35)
Albumin: 4.7 g/dL (ref 3.6–5.1)
Alkaline phosphatase (APISO): 129 U/L (ref 37–153)
BUN: 18 mg/dL (ref 7–25)
CO2: 28 mmol/L (ref 20–32)
Calcium: 10 mg/dL (ref 8.6–10.4)
Chloride: 103 mmol/L (ref 98–110)
Creat: 0.92 mg/dL (ref 0.50–1.05)
Globulin: 2.8 g/dL (ref 1.9–3.7)
Glucose, Bld: 115 mg/dL — ABNORMAL HIGH (ref 65–99)
Potassium: 4.3 mmol/L (ref 3.5–5.3)
Sodium: 140 mmol/L (ref 135–146)
Total Bilirubin: 0.4 mg/dL (ref 0.2–1.2)
Total Protein: 7.5 g/dL (ref 6.1–8.1)
eGFR: 67 mL/min/{1.73_m2} (ref >=60)

## 2023-03-01 LAB — SJOGRENS SYNDROME-A EXTRACTABLE NUCLEAR ANTIBODY: SSA (Ro) (ENA) Antibody, IgG: <1 AI

## 2023-03-01 LAB — SJOGRENS SYNDROME-B EXTRACTABLE NUCLEAR ANTIBODY: SSB (La) (ENA) Antibody, IgG: <1 AI

## 2023-03-01 LAB — RNP ANTIBODY: Ribonucleic Protein(ENA) Antibody, IgG: <1 AI

## 2023-03-01 LAB — CK: Total CK: 108 U/L (ref 29–143)

## 2023-03-01 LAB — ANTI-SMITH ANTIBODY: ENA SM Ab Ser-aCnc: <1 AI

## 2023-03-01 LAB — VITAMIN D 25 HYDROXY (VIT D DEFICIENCY, FRACTURES): Vit D, 25-Hydroxy: 56 ng/mL (ref 30–100)

## 2023-03-01 LAB — C3 AND C4
C3 Complement: 155 mg/dL (ref 83–193)
C4 Complement: 31 mg/dL (ref 15–57)

## 2023-03-01 LAB — ANA: Anti Nuclear Antibody (ANA): NEGATIVE

## 2023-03-01 LAB — ANTI-DNA ANTIBODY, DOUBLE-STRANDED: ds DNA Ab: <1 [IU]/mL

## 2023-03-01 LAB — TSH: TSH: 2.24 m[IU]/L (ref 0.40–4.50)

## 2023-03-01 LAB — VITAMIN B12: Vitamin B-12: 947 pg/mL (ref 200–1100)

## 2023-03-01 NOTE — Progress Notes (Signed)
All the labs are unremarkable.I will discuss results at the fu visit.

## 2023-03-17 NOTE — Progress Notes (Signed)
Office Visit Note  Patient: Stacy Dickerson             Date of Birth: Mar 10, 1956           MRN: [id number]             PCP: Mila Palmer, MD Referring: Mila Palmer, MD Visit Date: 03/31/2023 Occupation: @GUAROCC @  Subjective:  Hair loss and joint pain   History of Present Illness: Stacy Dickerson is a 67 y.o. female with cutaneous lupus and osteoarthritis.  She returns for a follow-up visit after her initial evaluation.  Patient states her appointment with the dermatologist at [facility] is pending.  She has been followed by Dr. Melida Quitter in [place].  She has been taking Plaquenil for cutaneous lupus which is prescribed by Dr. Melida Quitter.  She has been getting eye examinations at [facility].  Her last eye examination by Dr. Joseph Art was in July 2024.  According the patient the eye examination was normal.  She has been using topical Rogaine and also taking biotin.  She has not had any recent rash.  She denies any history of joint swelling.  She continues to have some discomfort in her knee joints.  She denies history of oral ulcers, nasal ulcers, malar rash, Raynaud's, lymphadenopathy, or inflammatory arthritis.    Activities of Daily Living:  Patient reports morning stiffness for 2 minutes.   Patient Reports nocturnal pain.  Difficulty dressing/grooming: Denies Difficulty climbing stairs: Reports Difficulty getting out of chair: Reports Difficulty using hands for taps, buttons, cutlery, and/or writing: Reports  Review of Systems  Constitutional:  Negative for fatigue.  HENT:  Positive for mouth dryness. Negative for mouth sores.   Eyes:  Negative for dryness.  Respiratory:  Negative for shortness of breath.   Cardiovascular:  Negative for chest pain and palpitations.  Gastrointestinal:  Positive for diarrhea. Negative for blood in stool and constipation.  Endocrine: Negative for increased urination.  Genitourinary:  Negative for involuntary urination.   Musculoskeletal:  Positive for joint pain, gait problem, joint pain, joint swelling and morning stiffness. Negative for myalgias, muscle weakness, muscle tenderness and myalgias.  Skin:  Positive for hair loss and sensitivity to sunlight. Negative for color change and rash.  Allergic/Immunologic: Negative for susceptible to infections.  Neurological:  Negative for dizziness and headaches.  Hematological:  Negative for swollen glands.  Psychiatric/Behavioral:  Negative for depressed mood and sleep disturbance. The patient is not nervous/anxious.     PMFS History:  Patient Active Problem List   Diagnosis Date Noted   Diabetes mellitus with coincident hypertension (HCC) 06/03/2022   Mixed hyperlipidemia 05/03/2020   Systemic lupus erythematosus (HCC) 05/03/2020    Past Medical History:  Diagnosis Date   Allergic rhinitis, unspecified    Arthritis    BMI 40.0-44.9, adult (HCC)    Chronic fatigue    Diabetes mellitus without complication (HCC)    Diverticulitis    Hyperlipidemia    Hypertension    IBS (irritable bowel syndrome)    Lupus    Major depressive disorder, recurrent episode, moderate (HCC)    Mild nonproliferative diabetic retinopathy of both eyes without macular edema (HCC)    Morbid obesity (HCC)    SLE (systemic lupus erythematosus related syndrome) (HCC)     Family History  Problem Relation Age of Onset   Breast cancer Mother 21   Cancer - Lung Mother    Alzheimer's disease Mother    Diabetes Mother    Hypertension Mother  Throat cancer Father    Prostate cancer Father    Past Surgical History:  Procedure Laterality Date   BREAST BIOPSY Left 2015   US guided   CHOLECYSTECTOMY     DIAGNOSTIC LAPAROSCOPY     MYOMECTOMY     TONSILLECTOMY     Social History   Social History Narrative   Not on file    There is no immunization history on file for this patient.   Objective: Vital Signs: BP 127/80 (BP Location: Right Arm, Patient Position: Sitting,  Cuff Size: Normal)   Pulse 79   Resp 14   Ht 5' 6.5" (1.689 m)   Wt 240 lb (108.9 kg)   BMI 38.16 kg/m    Physical Exam Vitals and nursing note reviewed.  Constitutional:      Appearance: She is well-developed.  HENT:     Head: Normocephalic and atraumatic.  Eyes:     Conjunctiva/sclera: Conjunctivae normal.  Cardiovascular:     Rate and Rhythm: Normal rate and regular rhythm.     Heart sounds: Normal heart sounds.  Pulmonary:     Effort: Pulmonary effort is normal.     Breath sounds: Normal breath sounds.  Abdominal:     General: Bowel sounds are normal.     Palpations: Abdomen is soft.  Musculoskeletal:     Cervical back: Normal range of motion.  Lymphadenopathy:     Cervical: No cervical adenopathy.  Skin:    General: Skin is warm and dry.     Capillary Refill: Capillary refill takes less than 2 seconds.     Comments: Hair thinning was noted on scalp.  Neurological:     Mental Status: She is alert and oriented to person, place, and time.  Psychiatric:        Behavior: Behavior normal.      Musculoskeletal Exam: Cervical, thoracic and lumbar spine were in good range of motion.  Shoulders, elbows, wrists, MCPs PIPs and DIPs with good range of motion with no synovitis.  Dupuytren contracture was noted in the right fourth finger.  Hip joints and knee joints with good range of motion.  She has crepitus in her knee joints without any warmth swelling or effusion.  There was no tenderness over ankles or MTPs.  CDAI Exam: CDAI Score: -- Patient Global: --; Provider Global: -- Swollen: --; Tender: -- Joint Exam 03/31/2023   No joint exam has been documented for this visit   There is currently no information documented on the homunculus. Go to the Rheumatology activity and complete the homunculus joint exam.  Investigation: No additional findings.  Imaging: No results found.  Recent Labs: Lab Results  Component Value Date   WBC 5.2 02/26/2023   HGB 13.2  02/26/2023   PLT 276 02/26/2023   NA 140 02/26/2023   K 4.3 02/26/2023   CL 103 02/26/2023   CO2 28 02/26/2023   GLUCOSE 115 (H) 02/26/2023   BUN 18 02/26/2023   CREATININE 0.92 02/26/2023   BILITOT 0.4 02/26/2023   ALKPHOS 92 03/30/2018   AST 20 02/26/2023   ALT 20 02/26/2023   PROT 7.5 02/26/2023   ALBUMIN 4.4 03/30/2018   CALCIUM 10.0 02/26/2023   GFRAA 71 05/03/2020   February 26, 2023 CK108, TSH normal, vitamin D 56, vitamin B12 947, ANA negative, ENA (RNP, Smith, SSA, SSB, dsDNA) negative, C3-C4 normal, ESR 6  January 06, 2022 skin biopsy diagnosis: Features consistent with cutaneous lupus erythematosus per pathology report.   July 19, 2014  ESR 11, ACE 66, C3-C4 normal, ANA negative, toxo antibodies negative  May 30, 2010 ANA negative  Speciality Comments: Ophthalmologist Dr. Joseph Art [facility]) 11/11/22 PLQ eye exam  Procedures:  No procedures performed Allergies: Ciprofloxacin, Keflex [cephalexin], Lisinopril, and Sulfa antibiotics   Assessment / Plan:     Visit Diagnoses: Cutaneous lupus erythematosus - April 07, 2022 skin biopsy positive for cutaneous lupus.  ANA negative, ENA negative, complements normal.  Patient denies any history of oral ulcers, nasal ulcers, malar rash,  Raynaud's, lymphadenopathy or inflammatory arthritis.  She gives history of rash and photosensitivity.  Use of sunscreen and sun protection was discussed.February 26, 2023 CK108, TSH normal, vitamin D 56, vitamin B12 947, ANA negative, ENA (RNP, Smith, SSA, SSB, dsDNA) negative, C3-C4 normal, ESR 6.  Lab results were reviewed with the patient.  High risk medication use - by Dr. Jill Poling 200 mg p.o. twice daily in October 2023.  She has been getting eye exam with the ophthalmologist Dr. Joseph Art twice a year.  According to the patient her last eye examination was November 11, 2022.  Hair loss - She is on biotin and Rogaine 5% over-the-counter.  She has an appointment with [facility] dermatology.  Patient reports flakiness of the scalp.  Use of Nizoral shampoo was discussed.  Primary osteoarthritis of both knees -she has discomfort in her knee joints.  No warmth or swelling was noted.  Crepitus was noted.  Bilateral moderate osteoarthritis and chondromalacia patella was noted on the x-rays.  X-ray findings were reviewed with the patient.  Patient had good results with Viscosupplement injections in the past.  Patient will contact us if she needs viscosupplement injections.  Dupuytren's contracture of right hand - Right ring finger.  It is not interfering with her routine activities.  Other fatigue-CK, TSH and B12 are normal.  Other medical problems listed as follows:  Mixed hyperlipidemia  Diabetes mellitus with coincident hypertension (HCC)-patient is followed by Dr. Talmage Nap.  Other irritable bowel syndrome  Primary hypertension-blood pressure was normal today.  Anxiety and depression  Vitamin D deficiency  Morbid (severe) obesity due to excess calories (HCC)  Orders: No orders of the defined types were placed in this encounter.  No orders of the defined types were placed in this encounter.     Follow-Up Instructions: Return in about 6 months (around 09/29/2023) for CLE, OA.   Pollyann Savoy, MD  Note - This record has been created using Animal nutritionist.  Chart creation errors have been sought, but may not always  have been located. Such creation errors do not reflect on  the standard of medical care.

## 2023-03-31 ENCOUNTER — Ambulatory Visit: Payer: 59 | Attending: Rheumatology | Admitting: Rheumatology

## 2023-03-31 ENCOUNTER — Encounter: Payer: Self-pay | Admitting: Rheumatology

## 2023-03-31 VITALS — BP 127/80 | HR 79 | Resp 14 | Ht 66.5 in | Wt 240.0 lb

## 2023-03-31 DIAGNOSIS — E559 Vitamin D deficiency, unspecified: Secondary | ICD-10-CM

## 2023-03-31 DIAGNOSIS — F419 Anxiety disorder, unspecified: Secondary | ICD-10-CM

## 2023-03-31 DIAGNOSIS — L659 Nonscarring hair loss, unspecified: Secondary | ICD-10-CM

## 2023-03-31 DIAGNOSIS — M170 Bilateral primary osteoarthritis of knee: Secondary | ICD-10-CM

## 2023-03-31 DIAGNOSIS — Z79899 Other long term (current) drug therapy: Secondary | ICD-10-CM

## 2023-03-31 DIAGNOSIS — M720 Palmar fascial fibromatosis [Dupuytren]: Secondary | ICD-10-CM

## 2023-03-31 DIAGNOSIS — R5383 Other fatigue: Secondary | ICD-10-CM

## 2023-03-31 DIAGNOSIS — I10 Essential (primary) hypertension: Secondary | ICD-10-CM

## 2023-03-31 DIAGNOSIS — E119 Type 2 diabetes mellitus without complications: Secondary | ICD-10-CM

## 2023-03-31 DIAGNOSIS — L932 Other local lupus erythematosus: Secondary | ICD-10-CM

## 2023-03-31 DIAGNOSIS — K588 Other irritable bowel syndrome: Secondary | ICD-10-CM

## 2023-03-31 DIAGNOSIS — E782 Mixed hyperlipidemia: Secondary | ICD-10-CM

## 2023-03-31 DIAGNOSIS — F32A Depression, unspecified: Secondary | ICD-10-CM

## 2023-03-31 NOTE — Patient Instructions (Signed)
Exercises for Chronic Knee Pain Chronic knee pain is pain that lasts longer than 3 months. For most people with chronic knee pain, exercise and weight loss is an important part of treatment. Your health care provider may want you to focus on: Making the muscles that support your knee stronger. This can take pressure off your knee and reduce pain. Preventing knee stiffness. How far you can move your knee, keeping it there or making it farther. Losing weight (if this applies) to take pressure off your knee, lower your risk for injury, and make it easier for you to exercise. Your provider will help you make an exercise program that fits your needs and physical abilities. Below are simple, low-impact exercises you can do at home. Ask your provider or physical therapist how often you should do your exercise program and how many times to repeat each exercise. General safety tips  Get your provider's approval before doing any exercises. Start slowly and stop any time you feel pain. Do not exercise if your knee pain is flaring up. Warm up first. Stretching a cold muscle can cause an injury. Do 5-10 minutes of easy movement or light stretching before beginning your exercises. Do 5-10 minutes of low-impact activity (like walking or cycling) before starting strengthening exercises. Contact your provider any time you have pain during or after exercising. Exercise can cause discomfort but should not be painful. It is normal to be a little stiff or sore after exercising. Stretching and range-of-motion exercises Front thigh stretch  Stand up straight and support your body by holding on to a chair or resting one hand on a wall. With your legs straight and close together, bend one knee to lift your heel up toward your butt. Using one hand for support, grab your ankle with your free hand. Pull your foot up closer toward your butt to feel the stretch in front of your thigh. Hold the stretch for 30  seconds. Repeat __________ times. Complete this exercise __________ times a day. Back thigh stretch  Sit on the floor with your back straight and your legs out straight in front of you. Place the palms of your hands on the floor and slide them toward your feet as you bend at the hip. Try to touch your nose to your knees and feel the stretch in the back of your thighs. Hold for 30 seconds. Repeat __________ times. Complete this exercise __________ times a day. Calf stretch  Stand facing a wall. Place the palms of your hands flat against the wall, arms extended, and lean slightly against the wall. Get into a lunge position with one leg bent at the knee and the other leg stretched out straight behind you. Keep both feet facing the wall and increase the bend in your knee while keeping the heel of the other leg flat on the ground. You should feel the stretch in your calf. Hold for 30 seconds. Repeat __________ times. Complete this exercise __________ times a day. Strengthening exercises Straight leg lift  Lie on your back with one knee bent and the other leg out straight. Slowly lift the straight leg without bending the knee. Lift until your foot is about 12 inches (30 cm) off the floor. Hold for 3-5 seconds and slowly lower your leg. Repeat __________ times. Complete this exercise __________ times a day. Single leg dip  Stand between two chairs and put both hands on the backs of the chairs for support. Extend one leg out straight with your body   weight resting on the heel of the standing leg. Slowly bend your standing knee to dip your body to the level that is comfortable for you. Hold for 3-5 seconds. Repeat __________ times. Complete this exercise __________ times a day. Hamstring curls  Stand straight, knees close together, facing the back of a chair. Hold on to the back of a chair with both hands. Keep one leg straight. Bend the other knee while bringing the heel up toward the butt  until the knee is bent at a 90-degree angle (right angle). Hold for 3-5 seconds. Repeat __________ times. Complete this exercise __________ times a day. Wall squat  Stand straight with your back, hips, and head against a wall. Step forward one foot at a time with your back still against the wall. Your feet should be 2 feet (61 cm) from the wall at shoulder width. Keeping your back, hips, and head against the wall, slide down the wall to as close to a sitting position as you can get. Hold for 5-10 seconds, then slowly slide back up. Repeat __________ times. Complete this exercise __________ times a day. Step-ups  Stand in front of a sturdy platform or stool that is about 6 inches (15 cm) high. Slowly step up with your left / right foot, keeping your knee in line with your hip and foot. Do not let your knee bend so far that you cannot see your toes. Hold on to a chair for balance, but do not use it for support. Slowly unlock your knee and lower yourself to the starting position. Repeat __________ times. Complete this exercise __________ times a day. Contact a health care provider if: Your exercises cause pain. Your pain is worse after you exercise. Your pain prevents you from doing your exercises. This information is not intended to replace advice given to you by your health care provider. Make sure you discuss any questions you have with your health care provider. Document Revised: 04/28/2022 Document Reviewed: 04/28/2022 Elsevier Patient Education  2024 [employer].  

## 2023-06-04 ENCOUNTER — Ambulatory Visit: Payer: 59 | Attending: Internal Medicine | Admitting: Internal Medicine

## 2023-06-04 ENCOUNTER — Telehealth: Payer: Self-pay | Admitting: *Deleted

## 2023-06-04 ENCOUNTER — Encounter: Payer: Self-pay | Admitting: Internal Medicine

## 2023-06-04 VITALS — BP 124/60 | HR 71 | Wt 249.0 lb

## 2023-06-04 DIAGNOSIS — E782 Mixed hyperlipidemia: Secondary | ICD-10-CM

## 2023-06-04 DIAGNOSIS — E119 Type 2 diabetes mellitus without complications: Secondary | ICD-10-CM | POA: Diagnosis not present

## 2023-06-04 DIAGNOSIS — M3219 Other organ or system involvement in systemic lupus erythematosus: Secondary | ICD-10-CM | POA: Diagnosis not present

## 2023-06-04 DIAGNOSIS — I10 Essential (primary) hypertension: Secondary | ICD-10-CM | POA: Diagnosis not present

## 2023-06-04 NOTE — Telephone Encounter (Signed)
 Patient contact the office requesting to apply for visco for her knees. Please advise.

## 2023-06-04 NOTE — Progress Notes (Signed)
 Cardiology Office Note:  .    Date:  06/04/2023  ID:  [name], DOB [birth year], MRN [id number] PCP: Verena Mems, MD  [facility] Providers Cardiologist:  Stanly DELENA Leavens, MD     CC: Secondary Prevention   History of Present Illness: .    Stacy Dickerson is a 68 y.o. female  with hypertension, diabetes, hyperlipidemia, and lupus who presents for a cardiology follow-up.  Approximately three months ago, she experienced a severe episode of vertigo lasting a week, during which she noted significantly low blood pressure readings, such as 89/54 mmHg, and dehydration. The etiology of these episodes is unclear. She uses meclizine for vertigo and possibly ondansetron for nausea. She consumes salt and vinegar potato chips to increase salt intake but does not notice a significant difference in her symptoms with this intervention.  She has a history of orthostatic hypotension and reports that during vertigo episodes, she cannot taste salt properly, describing it as tasting sweet, which leads her to use more salt than usual. She takes Lasix once daily and supplements with potassium, avoiding Lasix when dehydrated. She experiences some swelling around her ankles, which she notices more towards the end of the day.  No heart palpitations or chest pain. Her cholesterol levels are reportedly good, and her blood pressure has returned to normal levels. She has a history of obstructive sleep apnea but is no longer using CPAP.   Relevant histories: .  Social  2022:  Mother passed related to steroid issues.  This led to depression 2023: Had a Lupus Flare, on plaquenil.  Went to [place]  right before Christmas  2024: Started [employer].  She is having some issues with depth perception but had recent ophthalmology visit. She traveled to [facility], [place] , in June for a wine festival and plans to visit [place] in October 2025. She previously lived in [place] from age 80 to 21. ROS: As per  HPI.   Studies Reviewed: .   Cardiac Studies & Procedures      ECHOCARDIOGRAM  ECHOCARDIOGRAM COMPLETE 05/27/2020  Narrative ECHOCARDIOGRAM REPORT    Patient Name:   Stacy Dickerson Date of Exam: 05/27/2020 Medical Rec #:  [id number]     Height:       67.0 in Accession #:    [id number]    Weight:       276.0 lb Date of Birth:  04/07/1956     BSA:          2.319 m Patient Age:    65 years      BP:           120/74 mmHg Patient Gender: F             HR:           88 bpm. Exam Location:  [address]  Procedure: 2D Echo, Cardiac Doppler and Color Doppler  Indications:    R06.00 Dyspnea R07.9 Chest pain  History:        Patient has no prior history of Echocardiogram examinations. Risk Factors:Morbid Obesity, Hypertension, Dyslipidemia and Diabetes. Obstructive sleep apnea/.  Sonographer:    NaTashia Rodgers-Jones RDCS Referring Phys: [id number] [facility] A Anamika Kueker  IMPRESSIONS   1. Left ventricular ejection fraction, by estimation, is 60 to 65%. The left ventricle has normal function. The left ventricle has no regional wall motion abnormalities. There is mild asymmetric left ventricular hypertrophy of the basal and septal segments. Left ventricular diastolic parameters are consistent with Grade I diastolic dysfunction (  impaired relaxation). 2. Right ventricular systolic function is normal. The right ventricular size is normal. 3. Left atrial size was mildly dilated. 4. The mitral valve is abnormal. Trivial mitral valve regurgitation. No evidence of mitral stenosis. 5. The aortic valve is tricuspid. Aortic valve regurgitation is not visualized. Mild to moderate aortic valve sclerosis/calcification is present, without any evidence of aortic stenosis. 6. The inferior vena cava is normal in size with greater than 50% respiratory variability, suggesting right atrial pressure of 3 mmHg.  FINDINGS Left Ventricle: Left ventricular ejection fraction, by estimation, is 60 to 65%. The  left ventricle has normal function. The left ventricle has no regional wall motion abnormalities. The left ventricular internal cavity size was normal in size. There is mild asymmetric left ventricular hypertrophy of the basal and septal segments. Left ventricular diastolic parameters are consistent with Grade I diastolic dysfunction (impaired relaxation).  Right Ventricle: The right ventricular size is normal. No increase in right ventricular wall thickness. Right ventricular systolic function is normal.  Left Atrium: Left atrial size was mildly dilated.  Right Atrium: Right atrial size was normal in size.  Pericardium: There is no evidence of pericardial effusion.  Mitral Valve: The mitral valve is abnormal. There is mild thickening of the mitral valve leaflet(s). There is mild calcification of the mitral valve leaflet(s). Mild mitral annular calcification. Trivial mitral valve regurgitation. No evidence of mitral valve stenosis.  Tricuspid Valve: The tricuspid valve is normal in structure. Tricuspid valve regurgitation is not demonstrated. No evidence of tricuspid stenosis.  Aortic Valve: The aortic valve is tricuspid. Aortic valve regurgitation is not visualized. Mild to moderate aortic valve sclerosis/calcification is present, without any evidence of aortic stenosis.  Pulmonic Valve: The pulmonic valve was normal in structure. Pulmonic valve regurgitation is not visualized. No evidence of pulmonic stenosis.  Aorta: The aortic root is normal in size and structure.  Venous: The inferior vena cava is normal in size with greater than 50% respiratory variability, suggesting right atrial pressure of 3 mmHg.  IAS/Shunts: No atrial level shunt detected by color flow Doppler.   LEFT VENTRICLE PLAX 2D LVIDd:         3.20 cm  Diastology LVIDs:         1.90 cm  LV e' medial:    6.09 cm/s LV PW:         0.80 cm  LV E/e' medial:  11.1 LV IVS:        0.80 cm  LV e' lateral:   8.05 cm/s LVOT  diam:     2.10 cm  LV E/e' lateral: 8.4 LV SV:         66 LV SV Index:   29 LVOT Area:     3.46 cm   RIGHT VENTRICLE RV Basal diam:  3.30 cm RV S prime:     16.30 cm/s TAPSE (M-mode): 2.0 cm  LEFT ATRIUM             Index       RIGHT ATRIUM          Index LA diam:        3.80 cm 1.64 cm/m  RA Area:     8.91 cm LA Vol (A2C):   24.6 ml 10.61 ml/m RA Volume:   18.90 ml 8.15 ml/m LA Vol (A4C):   15.2 ml 6.55 ml/m LA Biplane Vol: 20.5 ml 8.84 ml/m AORTIC VALVE LVOT Vmax:   98.90 cm/s LVOT Vmean:  76.300 cm/s LVOT VTI:  0.191 m  AORTA Ao Root diam: 2.90 cm Ao Asc diam:  3.30 cm  MITRAL VALVE MV Area (PHT): 3.72 cm     SHUNTS MV Decel Time: 204 msec     Systemic VTI:  0.19 m MV E velocity: 67.40 cm/s   Systemic Diam: 2.10 cm MV A velocity: 105.00 cm/s MV E/A ratio:  0.64  Maude Emmer MD Electronically signed by Maude Emmer MD Signature Date/Time: 05/27/2020/1:28:21 PM    Final    CT SCANS  CT CORONARY MORPH W/CTA COR W/SCORE 05/31/2020  Addendum 06/03/2020  8:18 AM ADDENDUM REPORT: 06/03/2020 08:16  EXAM: OVER-READ INTERPRETATION  CT CHEST  The following report is an over-read performed by radiologist Dr. Selinda Saas [facility] on 06/03/2020. This over-read does not include interpretation of cardiac or coronary anatomy or pathology. The coronary CTA interpretation by the cardiologist is attached.  COMPARISON:  03/30/2018 chest CT angiogram.  FINDINGS: Cardiovascular: Normal heart size. No significant pericardial effusion/thickening. Great vessels are normal in course and caliber. No central pulmonary emboli.  Mediastinum/Nodes: Unremarkable esophagus. No pathologically enlarged mediastinal or hilar lymph nodes.  Lungs/Pleura: No pneumothorax. No pleural effusion. No acute consolidative airspace disease, lung masses or significant pulmonary nodules.  Upper abdomen: No acute abnormality.  Musculoskeletal: No aggressive appearing focal  osseous lesions. Moderate thoracic spondylosis.  IMPRESSION: No significant extracardiac findings.   Electronically Signed By: Selinda DELENA Blue M.D. On: 06/03/2020 08:16  Narrative CLINICAL DATA:  68 Year old African American female  EXAM: Cardiac/Coronary  CTA  TECHNIQUE: The patient was scanned on a [employer].  FINDINGS: A 100 kV prospective scan was triggered in the descending thoracic aorta at 111 HU's. Axial non-contrast 3 mm slices were carried out through the heart. The data set was analyzed on a dedicated work station and scored using the Agatson method. Gantry rotation speed was 250 msecs and collimation was .6 mm. No beta blockade and 0.8 mg of sl NTG was given. The 3D data set was reconstructed in 5% intervals of the 67-82 % of the R-R cycle. Diastolic phases were analyzed on a dedicated work station using MPR, MIP and VRT modes. The patient received 80 cc of contrast.  Aorta:  Normal size.  No calcifications.  No dissection.  Aortic Valve:  Tri-leaflet.  No calcifications.  Coronary Arteries:  Normal coronary origin.  Right dominance.  Coronary calcium score of 0. This was 1st percentile for age, sex, and race matched control.  RCA is a large dominant artery that gives rise to PDA and PLA. There is no plaque.  Left main is a large artery that gives rise to LAD and LCX arteries. There is no plaque.  LAD is a large vessel that gives rise to a small D1 vessel. There is no plaque.  LCX is a non-dominant artery that gives rise to one tortuous OM1 branch. There is no plaque.  Other findings:  Normal pulmonary vein drainage into the left atrium.  Normal left atrial appendage without a thrombus.  Normal size of the pulmonary artery.  Incidental left atrial diverticulum noted.  Extra-cardiac findings: See attached radiology report for non-cardiac structures.  IMPRESSION: 1.Coronary calcium score of 0. This was 1st percentile for age,  sex, and race matched control.  2. Normal coronary origin.  Right dominance.  3. CAD-RADS 0. No evidence of CAD (0%). Consider non-atherosclerotic causes of chest pain.  Electronically Signed: By: Stanly Leavens MD On: 05/31/2020 17:09  Physical Exam:    VS:  BP 124/60 (BP Location: Right Arm)   Pulse 71   Wt 249 lb (112.9 kg)   SpO2 96%   BMI 39.59 kg/m    Wt Readings from Last 3 Encounters:  06/04/23 249 lb (112.9 kg)  03/31/23 240 lb (108.9 kg)  02/26/23 239 lb 12.8 oz (108.8 kg)    Gen: no distress, Morbid Obesity   Neck: No JVD Cardiac: No Rubs or Gallops, no murmur, RRR +2 radial pulses Respiratory: Clear to auscultation bilaterally, normal effort, normal  respiratory rate GI: Soft, nontender, non-distended  MS: Trace edema, mild ankle;  moves all extremities Integument: Skin feels warm Neuro:  At time of evaluation, alert and oriented to person/place/time/situation  Psych: Normal affect, patient feels well   ASSESSMENT AND PLAN: .    Vertigo Intermittent episodes of vertigo lasting about a week, associated with hypotension (89/54) and dehydration. Uses meclizine and ondansetron for symptom management. Differential diagnosis includes Meniere's disease. Increased salt intake during hypotensive episodes as previously advised. - Continue meclizine and ondansetron as needed  Orthostatic Hypotension Recent episodes of hypotension managed with increased salt intake. - Will not increase her lasix despite the leg swelling  Peripheral Edema Mild ankle swelling likely due to fluid retention. On furosemide and potassium supplementation. Advised to adjust furosemide based on hydration status. Hesitant to increase furosemide due to hypotension and need for salt intake.  Hypertension Well-controlled hypertension. Blood pressure normalized since last visit.  Hyperlipidemia Well-controlled hyperlipidemia. Cholesterol levels within normal limits on  current medication.  Lupus No current exacerbations or active symptoms.  Obstructive Sleep Apnea Previously on CPAP therapy. No current symptoms reported.  General Health Maintenance Morbid Obesity: on GLP-1 RA Good health with no significant cardiac issues. Zero calcium score from prior evaluation.  Offered her Graduation, she would like to see me in one year  Stanly Leavens, MD FASE [facility] Cardiologist [facility]  [address] [place], [address] [place], [place] [address] [phone]  4:33 PM

## 2023-06-04 NOTE — Patient Instructions (Signed)

## 2023-06-05 NOTE — Telephone Encounter (Signed)
 Okay to apply for viscosupplement injections.

## 2023-06-10 NOTE — Telephone Encounter (Signed)
VOB submitted for Euflexxa, Bilateral knee(s) BV pending

## 2023-06-11 NOTE — Telephone Encounter (Signed)
Please call to schedule visco injections.  Approved for Euflexxa, Bilateral knee(s). Buy & Bill [phone] co-pay Drug is covered at 90% of allowable amount and administration are covered at 100% of the allowable amount Deductible must be met before coverage applies $500 (met $0) If OOP is met $2500 (met $98.47) coverage goes to 100% and copay is waived. No prior authorization required

## 2023-08-05 ENCOUNTER — Ambulatory Visit
Admission: RE | Admit: 2023-08-05 | Discharge: 2023-08-05 | Disposition: A | Discharge status: Home / Self Care | Payer: 59 | Source: Ambulatory Visit | Attending: Family Medicine | Admitting: Family Medicine

## 2023-08-05 DIAGNOSIS — E2839 Other primary ovarian failure: Secondary | ICD-10-CM

## 2023-08-11 ENCOUNTER — Ambulatory Visit: Payer: 59 | Attending: Physician Assistant | Admitting: Physician Assistant

## 2023-08-11 DIAGNOSIS — M1712 Unilateral primary osteoarthritis, left knee: Secondary | ICD-10-CM

## 2023-08-11 MED ORDER — LIDOCAINE HCL 1 % IJ SOLN
1.5000 mL | INTRAMUSCULAR | Status: AC | PRN
  Administered 2023-08-11: 1.5 mL

## 2023-08-11 MED ORDER — SODIUM HYALURONATE (VISCOSUP) 20 MG/2ML IX SOSY
20.0000 mg | PREFILLED_SYRINGE | INTRA_ARTICULAR | Status: AC | PRN
  Administered 2023-08-11: 20 mg via INTRA_ARTICULAR

## 2023-08-11 NOTE — Progress Notes (Signed)
   Procedure Note  Patient: Stacy Dickerson             Date of Birth: [birth year]           MRN: [id number]             Visit Date: 08/11/2023  Procedures: Visit Diagnoses:  1. Primary osteoarthritis of left knee    Euflexxa #1 bilateral knees, B/B Large Joint Inj: bilateral knee on 08/11/2023 3:22 PM Indications: pain Details: 27 G 1.5 in needle, medial approach  Arthrogram: No  Medications (Right): 1.5 mL lidocaine 1 %; 20 mg Sodium Hyaluronate (Viscosup) 20 MG/2ML Aspirate (Right): 0 mL Medications (Left): 1.5 mL lidocaine 1 %; 20 mg Sodium Hyaluronate (Viscosup) 20 MG/2ML Aspirate (Left): 0 mL Outcome: tolerated well, no immediate complications Procedure, treatment alternatives, risks and benefits explained, specific risks discussed. Consent was given by the patient. Immediately prior to procedure a time out was called to verify the correct patient, procedure, equipment, support staff and site/side marked as required.     Patient tolerated the procedures well.  Aftercare was discussed.  Jacinta Martinis, PA-C

## 2023-08-18 ENCOUNTER — Ambulatory Visit: Payer: 59 | Attending: Physician Assistant | Admitting: Physician Assistant

## 2023-08-18 DIAGNOSIS — M170 Bilateral primary osteoarthritis of knee: Secondary | ICD-10-CM

## 2023-08-18 MED ORDER — SODIUM HYALURONATE (VISCOSUP) 20 MG/2ML IX SOSY
20.0000 mg | PREFILLED_SYRINGE | INTRA_ARTICULAR | Status: AC | PRN
  Administered 2023-08-18: 20 mg via INTRA_ARTICULAR

## 2023-08-18 MED ORDER — LIDOCAINE HCL 1 % IJ SOLN
1.5000 mL | INTRAMUSCULAR | Status: AC | PRN
  Administered 2023-08-18: 1.5 mL

## 2023-08-18 NOTE — Progress Notes (Signed)
   Procedure Note  Patient: Stacy Dickerson             Date of Birth: May 20, 1955           MRN: [id number]             Visit Date: 08/18/2023  Procedures: Visit Diagnoses:  1. Primary osteoarthritis of both knees     Euflexxa #2 bilateral knees, B/B Large Joint Inj: bilateral knee on 08/18/2023 2:27 PM Indications: pain Details: 27 G 1.5 in needle, medial approach  Arthrogram: No  Medications (Right): 1.5 mL lidocaine  1 %; 20 mg Sodium Hyaluronate (Viscosup) 20 MG/2ML Aspirate (Right): 0 mL Medications (Left): 1.5 mL lidocaine  1 %; 20 mg Sodium Hyaluronate (Viscosup) 20 MG/2ML Aspirate (Left): 0 mL Outcome: tolerated well, no immediate complications Procedure, treatment alternatives, risks and benefits explained, specific risks discussed. Consent was given by the patient. Immediately prior to procedure a time out was called to verify the correct patient, procedure, equipment, support staff and site/side marked as required.     Patient tolerated the procedures well. Aftercare was discussed.  Jacinta Martinis, PA-C

## 2023-08-25 ENCOUNTER — Ambulatory Visit: Payer: 59 | Attending: Physician Assistant | Admitting: Physician Assistant

## 2023-08-25 DIAGNOSIS — M170 Bilateral primary osteoarthritis of knee: Secondary | ICD-10-CM | POA: Diagnosis not present

## 2023-08-25 MED ORDER — SODIUM HYALURONATE (VISCOSUP) 20 MG/2ML IX SOSY
20.0000 mg | PREFILLED_SYRINGE | INTRA_ARTICULAR | Status: AC | PRN
  Administered 2023-08-25: 20 mg via INTRA_ARTICULAR

## 2023-08-25 MED ORDER — LIDOCAINE HCL 1 % IJ SOLN
1.5000 mL | INTRAMUSCULAR | Status: AC | PRN
  Administered 2023-08-25: 1.5 mL

## 2023-08-25 NOTE — Progress Notes (Signed)
   Procedure Note  Patient: Stacy Dickerson             Date of Birth: 1955-09-27           MRN: [id number]             Visit Date: 08/25/2023  Procedures: Visit Diagnoses:  1. Primary osteoarthritis of both knees    Euflexxa #3 Bilateral knee injections  Large Joint Inj: bilateral knee on 08/25/2023 9:38 AM Indications: pain Details: 27 G 1.5 in needle, medial approach  Arthrogram: No  Medications (Right): 1.5 mL lidocaine  1 %; 20 mg Sodium Hyaluronate (Viscosup) 20 MG/2ML Aspirate (Right): 0 mL Medications (Left): 1.5 mL lidocaine  1 %; 20 mg Sodium Hyaluronate (Viscosup) 20 MG/2ML Aspirate (Left): 0 mL Outcome: tolerated well, no immediate complications Procedure, treatment alternatives, risks and benefits explained, specific risks discussed. Consent was given by the patient. Immediately prior to procedure a time out was called to verify the correct patient, procedure, equipment, support staff and site/side marked as required. Patient was prepped and draped in the usual sterile fashion.     Patient tolerated the procedures well. Aftercare was discussed.  Jacinta Martinis, PA-C

## 2023-09-15 ENCOUNTER — Ambulatory Visit: Payer: 59 | Admitting: Physician Assistant

## 2023-09-15 NOTE — Progress Notes (Signed)
 Office Visit Note  Patient: Stacy Dickerson             Date of Birth: 12/27/1955           MRN: [id number]             PCP: Olin Bertin, MD Referring: Olin Bertin, MD Visit Date: 09/29/2023 Occupation: @GUAROCC @  Subjective:  Joint pain  History of Present Illness: Stacy Dickerson is a 68 y.o. female with cutaneous lupus and osteoarthritis.  She has not had any rash from cutaneous lupus.  She continues to take Plaquenil 200 mg p.o. twice daily by Dr. Henreitta Locus.  She is not using any topical agents.  She has an appointment coming up with Dr. Baker Bon tomorrow.  She states for the hair loss she was using Rogaine 5% over-the-counter but she discontinued it due to itching.  She continues to take biotin.  She states that hair loss has improved since her last visit.  She has been experiencing some discomfort in her left knee joint and left hip joint.  She states is not triggering from the right ring finger Dupuytren's contracture.    Activities of Daily Living:  Patient reports morning stiffness for a few minutes intermittently throughout the day.  Patient Reports nocturnal pain.  Difficulty dressing/grooming: Denies Difficulty climbing stairs: Reports Difficulty getting out of chair: Reports Difficulty using hands for taps, buttons, cutlery, and/or writing: Denies  Review of Systems  Constitutional:  Positive for fatigue.  HENT:  Positive for mouth dryness. Negative for mouth sores.   Eyes:  Negative for dryness.  Respiratory:  Negative for shortness of breath.   Cardiovascular:  Negative for chest pain and palpitations.  Gastrointestinal:  Positive for diarrhea. Negative for blood in stool and constipation.  Endocrine: Negative for increased urination.  Genitourinary:  Negative for involuntary urination.  Musculoskeletal:  Positive for joint pain, gait problem, joint pain, myalgias, morning stiffness, muscle tenderness and myalgias. Negative for joint swelling and muscle  weakness.  Skin:  Negative for color change, rash, hair loss and sensitivity to sunlight.  Allergic/Immunologic: Negative for susceptible to infections.  Neurological:  Negative for dizziness and headaches.  Hematological:  Negative for swollen glands.  Psychiatric/Behavioral:  Negative for depressed mood and sleep disturbance. The patient is not nervous/anxious.     PMFS History:  Patient Active Problem List   Diagnosis Date Noted   Hypertension 06/04/2023   Diabetes mellitus with coincident hypertension (HCC) 06/03/2022   Morbid obesity (HCC) 07/19/2020   Mixed hyperlipidemia 05/03/2020   Systemic lupus erythematosus (HCC) 05/03/2020    Past Medical History:  Diagnosis Date   Allergic rhinitis, unspecified    Arthritis    BMI 40.0-44.9, adult (HCC)    Chronic fatigue    Diabetes mellitus without complication (HCC)    Diverticulitis    Hyperlipidemia    Hypertension    IBS (irritable bowel syndrome)    Lupus    Major depressive disorder, recurrent episode, moderate (HCC)    Mild nonproliferative diabetic retinopathy of both eyes without macular edema (HCC)    Morbid obesity (HCC)    SLE (systemic lupus erythematosus related syndrome) (HCC)     Family History  Problem Relation Age of Onset   Breast cancer Mother 14   Cancer - Lung Mother    Alzheimer's disease Mother    Diabetes Mother    Hypertension Mother    Throat cancer Father    Prostate cancer Father    Past Surgical History:  Procedure Laterality Date   BREAST BIOPSY Left 2015   US  guided   CHOLECYSTECTOMY     DIAGNOSTIC LAPAROSCOPY     MYOMECTOMY     TONSILLECTOMY     Social History   Social History Narrative   Not on file    There is no immunization history on file for this patient.   Objective: Vital Signs: BP 118/73 (BP Location: Right Wrist, Patient Position: Sitting, Cuff Size: Large)   Pulse 69   Resp 13   Ht 5\' 6"  (1.676 m)   Wt 240 lb (108.9 kg)   BMI 38.74 kg/m    Physical  Exam Vitals and nursing note reviewed.  Constitutional:      Appearance: She is well-developed.  HENT:     Head: Normocephalic and atraumatic.  Eyes:     Conjunctiva/sclera: Conjunctivae normal.  Cardiovascular:     Rate and Rhythm: Normal rate and regular rhythm.     Heart sounds: Normal heart sounds.  Pulmonary:     Effort: Pulmonary effort is normal.     Breath sounds: Normal breath sounds.  Abdominal:     General: Bowel sounds are normal.     Palpations: Abdomen is soft.  Musculoskeletal:     Cervical back: Normal range of motion.  Lymphadenopathy:     Cervical: No cervical adenopathy.  Skin:    General: Skin is warm and dry.     Capillary Refill: Capillary refill takes less than 2 seconds.     Comments: Hair regrowth noted.  Some hair thinning was noted in the bilateral temporal region.  Neurological:     Mental Status: She is alert and oriented to person, place, and time.  Psychiatric:        Behavior: Behavior normal.      Musculoskeletal Exam: Cervical, thoracic and lumbar spine were in good range of motion.  Shoulders, elbows, wrist joints with good range of motion.  There was no synovitis over MCPs PIPs or DIPs.  Appearance contracture was noted in the right ring flexor tendon.  She had discomfort range of motion of her left hip joint.  Hip joints in good range of motion.  Knee joints with good range of motion.  No warmth swelling or effusion was noted.  There was no tenderness over ankles or MTPs.  CDAI Exam: CDAI Score: -- Patient Global: --; Provider Global: -- Swollen: --; Tender: -- Joint Exam 09/29/2023   No joint exam has been documented for this visit   There is currently no information documented on the homunculus. Go to the Rheumatology activity and complete the homunculus joint exam.  Investigation: No additional findings.  Imaging: XR HIP UNILAT W OR W/O PELVIS 2-3 VIEWS LEFT Result Date: 09/29/2023 Moderate narrowing of bilateral hip joints was  noted.  More prominent on the left side.  No SI joint narrowing or sclerosis was noted.  Degenerative changes were noted in the lumbar spine. Impression: Moderate osteoarthritis was noted in bilateral hips.   Recent Labs: Lab Results  Component Value Date   WBC 5.2 02/26/2023   HGB 13.2 02/26/2023   PLT 276 02/26/2023   NA 140 02/26/2023   K 4.3 02/26/2023   CL 103 02/26/2023   CO2 28 02/26/2023   GLUCOSE 115 (H) 02/26/2023   BUN 18 02/26/2023   CREATININE 0.92 02/26/2023   BILITOT 0.4 02/26/2023   ALKPHOS 92 03/30/2018   AST 20 02/26/2023   ALT 20 02/26/2023   PROT 7.5 02/26/2023   ALBUMIN  4.4 03/30/2018   CALCIUM 10.0 02/26/2023   GFRAA 71 05/03/2020    Speciality Comments: Ophthalmologist Dr. Brooks Cao [facility]) 11/11/22 PLQ eye exam  Procedures:  No procedures performed Allergies: Ciprofloxacin, Keflex [cephalexin], Lisinopril, and Sulfa antibiotics   Assessment / Plan:     Visit Diagnoses: Cutaneous lupus erythematosus - April 07, 2022 skin biopsy positive for cutaneous lupus.  ANA negative, ENA negative, complements normal. -She has not had any recent rash from CLE.  She has been using hydroxychloroquine on a regular basis.  Labs obtained in November 2024 ANA negative, complements normal, double-stranded DNA negative, SSA negative, SSB negative, Smith negative, RNP negative.  Sed rate was normal.  Use of sunscreen was advised.  Plan: Protein / creatinine ratio, urine, Anti-DNA antibody, double-stranded, C3 and C4, Sedimentation rate  High risk medication use - by Dr. McCarthy,hydroxychloroquine 200 mg p.o. twice daily in October 2023. - Plan: CBC with Differential/Platelet, Comprehensive metabolic panel with GFR.  Patient has been getting eye examination on an annual basis.  Last eye examination was in July 2024.  Hair loss -she noticed improvement with topical Rogaine 5% solution.  She recently discontinued it due to itchy scalp.  She has been using biotin.  She has an  appointment coming up with Dr. Baker Bon.  Hair regrowth was noted.  She had hair thinning in the temporal region.  TSH, vitamin D  were within normal limits in November 2024.  Pain in left hip -she has been experiencing pain and discomfort in her left hip joint.  She states the pain is not interfering with her routine activity.  Plan: XR HIP UNILAT W OR W/O PELVIS 2-3 VIEWS LEFT.  The EXTR showed bilateral moderate osteoarthritic changes.  Degenerative changes were also noted in the lumbar spine.  X-ray findings were reviewed with the patient.  I advised patient to contact me if her symptoms get worse.  We may consider cortisone injection.  She may benefit from weight loss.  A handout on hip exercises was given.  Primary osteoarthritis of both hips  Primary osteoarthritis of both knees - s/p euflexxa bilateral knees 07/2023.  She continues to have some discomfort in her knee joints.  Left knee joint has been more bothersome.  The x-rays from November 2024 showed moderate to severe osteoarthritis of the left knee joint and moderate osteoarthritis of the right knee joint.  A handout on lower extremity muscle strengthening exercise was given.  Dupuytren's contracture of right hand - Right ring finger.  Not symptomatic.  Other fatigue - CK, TSH and B12 are normal.  Other medical problems are listed as follows:  Mixed hyperlipidemia  Other irritable bowel syndrome  Diabetes mellitus with coincident hypertension (HCC) - patient is followed by Dr. Ronelle Coffee.  Primary hypertension-blood pressure was normal at 118/73.  Anxiety and depression  Vitamin D  deficiency-vitamin D  was56 on February 26, 2023.  Morbid (severe) obesity due to excess calories (HCC)  Orders: Orders Placed This Encounter  Procedures   XR HIP UNILAT W OR W/O PELVIS 2-3 VIEWS LEFT   Protein / creatinine ratio, urine   CBC with Differential/Platelet   Comprehensive metabolic panel with GFR   Anti-DNA antibody, double-stranded    C3 and C4   Sedimentation rate   No orders of the defined types were placed in this encounter.    Follow-Up Instructions: Return in about 6 months (around 03/30/2024) for CLE, OA.   Nicholas Bari, MD  Note - This record has been created using Animal nutritionist.  Chart creation errors have been sought, but may not always  have been located. Such creation errors do not reflect on  the standard of medical care.

## 2023-09-22 ENCOUNTER — Ambulatory Visit: Payer: 59 | Admitting: Physician Assistant

## 2023-09-29 ENCOUNTER — Ambulatory Visit: Payer: 59 | Admitting: Physician Assistant

## 2023-09-29 ENCOUNTER — Encounter: Payer: Self-pay | Admitting: Rheumatology

## 2023-09-29 ENCOUNTER — Ambulatory Visit: Payer: 59 | Attending: Rheumatology | Admitting: Rheumatology

## 2023-09-29 ENCOUNTER — Ambulatory Visit (INDEPENDENT_AMBULATORY_CARE_PROVIDER_SITE_OTHER)

## 2023-09-29 VITALS — BP 118/73 | HR 69 | Resp 13 | Ht 66.0 in | Wt 240.0 lb

## 2023-09-29 DIAGNOSIS — M170 Bilateral primary osteoarthritis of knee: Secondary | ICD-10-CM

## 2023-09-29 DIAGNOSIS — M160 Bilateral primary osteoarthritis of hip: Secondary | ICD-10-CM

## 2023-09-29 DIAGNOSIS — M25552 Pain in left hip: Secondary | ICD-10-CM

## 2023-09-29 DIAGNOSIS — K588 Other irritable bowel syndrome: Secondary | ICD-10-CM

## 2023-09-29 DIAGNOSIS — L659 Nonscarring hair loss, unspecified: Secondary | ICD-10-CM

## 2023-09-29 DIAGNOSIS — F419 Anxiety disorder, unspecified: Secondary | ICD-10-CM

## 2023-09-29 DIAGNOSIS — M720 Palmar fascial fibromatosis [Dupuytren]: Secondary | ICD-10-CM

## 2023-09-29 DIAGNOSIS — L932 Other local lupus erythematosus: Secondary | ICD-10-CM | POA: Diagnosis not present

## 2023-09-29 DIAGNOSIS — Z79899 Other long term (current) drug therapy: Secondary | ICD-10-CM | POA: Diagnosis not present

## 2023-09-29 DIAGNOSIS — R5383 Other fatigue: Secondary | ICD-10-CM

## 2023-09-29 DIAGNOSIS — I10 Essential (primary) hypertension: Secondary | ICD-10-CM

## 2023-09-29 DIAGNOSIS — E119 Type 2 diabetes mellitus without complications: Secondary | ICD-10-CM

## 2023-09-29 DIAGNOSIS — F32A Depression, unspecified: Secondary | ICD-10-CM

## 2023-09-29 DIAGNOSIS — E559 Vitamin D deficiency, unspecified: Secondary | ICD-10-CM

## 2023-09-29 DIAGNOSIS — E782 Mixed hyperlipidemia: Secondary | ICD-10-CM

## 2023-09-29 NOTE — Patient Instructions (Addendum)
 Hip Exercises Ask your health care provider which exercises are safe for you. Do exercises exactly as told by your provider and adjust them as told. It is normal to feel mild stretching, pulling, tightness, or discomfort as you do these exercises. Stop right away if you feel sudden pain or your pain gets worse. Do not begin these exercises until told by your provider. Stretching and range-of-motion exercises These exercises warm up your muscles and joints and improve the movement and flexibility of your hip. They also help to relieve pain, numbness, and tingling. You may be asked to limit your range of motion if you had a hip replacement. Talk to your provider about these limits. Hamstrings, supine  Lie on your back (supine position). Loop a belt, towel, or exercise band over the ball of your left / right foot. The ball of your foot is on the walking surface, right under your toes. Straighten your left / right knee and slowly pull on the belt, towel, or band to raise your leg until you feel a gentle stretch behind your knee (hamstring). Do not let your knee bend while you do this. Keep your other leg flat on the floor. Hold this position for __________ seconds. Slowly return your leg to the starting position. Repeat __________ times. Complete this exercise __________ times a day. Hip rotation  Lie on your back on a firm surface. With your left / right hand, gently pull your left / right knee toward the shoulder that is on the same side of the body. Stop when your knee is pointing toward the ceiling. Hold your left / right ankle with your other hand. Keeping your knee steady, gently pull your left / right ankle toward your other shoulder until you feel a stretch in your butt. Keep your hips and shoulders firmly planted while you do this stretch. Hold this position for __________ seconds. Repeat __________ times. Complete this exercise __________ times a day. Seated stretch This exercise is  sometimes called hamstrings and adductors stretch. Sit on the floor with your legs stretched wide. Keep your knees straight during this exercise. Keeping your head and back in a straight line, bend at your waist to reach for your left foot (position A). You should feel a stretch in your right inner thigh (adductors). Hold this position for __________ seconds. Then slowly return to the upright position. Keeping your head and back in a straight line, bend at your waist to reach forward (position B). You should feel a stretch behind both of your thighs and knees (hamstrings). Hold this position for __________ seconds. Then slowly return to the upright position. Keeping your head and back in a straight line, bend at your waist to reach for your right foot (position C). You should feel a stretch in your left inner thigh (adductors). Hold this position for __________ seconds. Then slowly return to the upright position. Repeat __________ times. Complete this exercise __________ times a day. Lunge This exercise stretches the muscles of the hip (hip flexors). Place your left / right knee on the floor and bend your other knee so that is directly over your ankle. You should be half-kneeling. Keep good posture with your head over your shoulders. Tighten your butt muscles to point your tailbone downward. This will prevent your back from arching too much. You should feel a gentle stretch in the front of your left / right thigh and hip. If you do not feel a stretch, slide your other foot forward slightly and  then slowly lunge forward with your chest up until your knee once again lines up over your ankle. Make sure your tailbone continues to point downward. Hold this position for __________ seconds. Slowly return to the starting position. Repeat __________ times. Complete this exercise __________ times a day. Strengthening exercises These exercises build strength and endurance in your hip. Endurance is the  ability to use your muscles for a long time, even after they get tired. Bridge This exercise strengthens the muscles of your hip (hip extensors). Lie on your back on a firm surface with your knees bent and your feet flat on the floor. Tighten your butt muscles and lift your bottom off the floor until the trunk of your body and your hips are level with your thighs. Do not arch your back. You should feel the muscles working in your butt and the back of your thighs. If you do not feel these muscles, slide your feet 1-2 inches (2.5-5 cm) farther away from your butt. Hold this position for __________ seconds. Slowly lower your hips to the starting position. Let your muscles relax completely between repetitions. Repeat __________ times. Complete this exercise __________ times a day. Straight leg raises, side-lying This exercise strengthens the muscles that move the hip joint away from the center of the body (hip abductors). Lie on your side with your left / right leg in the top position. Lie so your head, shoulder, hip, and knee line up. You may bend your bottom knee slightly to help you balance. Roll your hips slightly forward, so your hips are stacked directly over each other and your left / right knee is facing forward. Leading with your heel, lift your top leg 4-6 inches (10-15 cm). You should feel the muscles in your top hip lifting. Do not let your foot drift forward. Do not let your knee roll toward the ceiling. Hold this position for __________ seconds. Slowly return to the starting position. Let your muscles relax completely between repetitions. Repeat __________ times. Complete this exercise __________ times a day. Straight leg raises, side-lying This exercise strengthens the muscles that move the hip joint toward the center of the body (hip adductors). Lie on your side with your left / right leg in the bottom position. Lie so your head, shoulder, hip, and knee line up. You may place your  upper foot in front to help you balance. Roll your hips slightly forward, so your hips are stacked directly over each other and your left / right knee is facing forward. Tense the muscles in your inner thigh and lift your bottom leg 4-6 inches (10-15 cm). Hold this position for __________ seconds. Slowly return to the starting position. Let your muscles relax completely between repetitions. Repeat __________ times. Complete this exercise __________ times a day. Straight leg raises, supine This exercise strengthens the muscles in the front of your thigh (quadriceps and hip flexors). Lie on your back (supine position) with your left / right leg extended and your other knee bent. Tense the muscles in the front of your left / right thigh. You should see your kneecap slide up or see increased dimpling just above your knee. Keep these muscles tight as you raise your leg 4-6 inches (10-15 cm) off the floor. Do not let your knee bend. Hold this position for __________ seconds. Keep these muscles tense as you lower your leg. Relax the muscles slowly and completely between repetitions. Repeat __________ times. Complete this exercise __________ times a day. Hip abductors, standing This  exercise strengthens the muscles that move the leg and hip joint away from the center of the body (hip abductors). Tie one end of a rubber exercise band or tubing to a secure surface, such as a chair, table, or pole. Loop the other end of the band or tubing around your left / right ankle. Keeping your ankle with the band or tubing directly opposite the secured end, step away until there is tension in the tubing or band. Hold on to a chair, table, or pole as needed for balance. Lift your left / right leg out to your side. While you do this: Keep your back upright. Keep your shoulders over your hips. Keep your toes pointing forward. Make sure to use your hip muscles to slowly lift your leg. Do not tip your body or  forcefully lift your leg. Hold this position for __________ seconds. Slowly return to the starting position. Repeat __________ times. Complete this exercise __________ times a day. Squats This exercise strengthens the muscles in the front of your thigh (quadriceps). Stand in front of a table, or stand in a doorframe so your feet and knees are in line with the frame. You may place your hands on the table or frame for balance. Slowly bend your knees and lower your hips like you are going to sit in a chair. Keep your lower legs in a straight up-and-down position. Do not let your hips go lower than your knees. Do not bend your knees lower than told by your provider. If your hip pain increases, do not bend as low. Hold this position for ___________ seconds. Slowly push with your legs to return to standing. Do not use your hands to pull yourself to standing. Repeat __________ times. Complete this exercise __________ times a day. This information is not intended to replace advice given to you by your health care provider. Make sure you discuss any questions you have with your health care provider. Document Revised: 12/16/2021 Document Reviewed: 12/16/2021 [employer] Patient Education  2024 [employer]  Exercises for Chronic Knee Pain Chronic knee pain is pain that lasts longer than 3 months. For most people with chronic knee pain, exercise and weight loss is an important part of treatment. Your health care provider may want you to focus on: Making the muscles that support your knee stronger. This can take pressure off your knee and reduce pain. Preventing knee stiffness. How far you can move your knee, keeping it there or making it farther. Losing weight (if this applies) to take pressure off your knee, lower your risk for injury, and make it easier for you to exercise. Your provider will help you make an exercise program that fits your needs and physical abilities. Below are simple, low-impact  exercises you can do at home. Ask your provider or physical therapist how often you should do your exercise program and how many times to repeat each exercise. General safety tips  Get your provider's approval before doing any exercises. Start slowly and stop any time you feel pain. Do not exercise if your knee pain is flaring up. Warm up first. Stretching a cold muscle can cause an injury. Do 5-10 minutes of easy movement or light stretching before beginning your exercises. Do 5-10 minutes of low-impact activity (like walking or cycling) before starting strengthening exercises. Contact your provider any time you have pain during or after exercising. Exercise can cause discomfort but should not be painful. It is normal to be a little stiff or sore after  exercising. Stretching and range-of-motion exercises Front thigh stretch  Stand up straight and support your body by holding on to a chair or resting one hand on a wall. With your legs straight and close together, bend one knee to lift your heel up toward your butt. Using one hand for support, grab your ankle with your free hand. Pull your foot up closer toward your butt to feel the stretch in front of your thigh. Hold the stretch for 30 seconds. Repeat __________ times. Complete this exercise __________ times a day. Back thigh stretch  Sit on the floor with your back straight and your legs out straight in front of you. Place the palms of your hands on the floor and slide them toward your feet as you bend at the hip. Try to touch your nose to your knees and feel the stretch in the back of your thighs. Hold for 30 seconds. Repeat __________ times. Complete this exercise __________ times a day. Calf stretch  Stand facing a wall. Place the palms of your hands flat against the wall, arms extended, and lean slightly against the wall. Get into a lunge position with one leg bent at the knee and the other leg stretched out straight behind  you. Keep both feet facing the wall and increase the bend in your knee while keeping the heel of the other leg flat on the ground. You should feel the stretch in your calf. Hold for 30 seconds. Repeat __________ times. Complete this exercise __________ times a day. Strengthening exercises Straight leg lift  Lie on your back with one knee bent and the other leg out straight. Slowly lift the straight leg without bending the knee. Lift until your foot is about 12 inches (30 cm) off the floor. Hold for 3-5 seconds and slowly lower your leg. Repeat __________ times. Complete this exercise __________ times a day. Single leg dip  Stand between two chairs and put both hands on the backs of the chairs for support. Extend one leg out straight with your body weight resting on the heel of the standing leg. Slowly bend your standing knee to dip your body to the level that is comfortable for you. Hold for 3-5 seconds. Repeat __________ times. Complete this exercise __________ times a day. Hamstring curls  Stand straight, knees close together, facing the back of a chair. Hold on to the back of a chair with both hands. Keep one leg straight. Bend the other knee while bringing the heel up toward the butt until the knee is bent at a 90-degree angle (right angle). Hold for 3-5 seconds. Repeat __________ times. Complete this exercise __________ times a day. Wall squat  Stand straight with your back, hips, and head against a wall. Step forward one foot at a time with your back still against the wall. Your feet should be 2 feet (61 cm) from the wall at shoulder width. Keeping your back, hips, and head against the wall, slide down the wall to as close to a sitting position as you can get. Hold for 5-10 seconds, then slowly slide back up. Repeat __________ times. Complete this exercise __________ times a day. Step-ups  Stand in front of a sturdy platform or stool that is about 6 inches (15 cm)  high. Slowly step up with your left / right foot, keeping your knee in line with your hip and foot. Do not let your knee bend so far that you cannot see your toes. Hold on to a chair for balance,  but do not use it for support. Slowly unlock your knee and lower yourself to the starting position. Repeat __________ times. Complete this exercise __________ times a day. Contact a health care provider if: Your exercises cause pain. Your pain is worse after you exercise. Your pain prevents you from doing your exercises. This information is not intended to replace advice given to you by your health care provider. Make sure you discuss any questions you have with your health care provider. Document Revised: 04/28/2022 Document Reviewed: 04/28/2022 [employer] Patient Education  2024 [employer].

## 2023-09-30 ENCOUNTER — Ambulatory Visit: Payer: Self-pay | Admitting: Rheumatology

## 2023-09-30 LAB — COMPREHENSIVE METABOLIC PANEL WITH GFR
AG Ratio: 1.4 (calc) (ref 1.0–2.5)
ALT: 17 U/L (ref 6–29)
AST: 19 U/L (ref 10–35)
Albumin: 4.6 g/dL (ref 3.6–5.1)
Alkaline phosphatase (APISO): 112 U/L (ref 37–153)
BUN: 23 mg/dL (ref 7–25)
CO2: 26 mmol/L (ref 20–32)
Calcium: 9.8 mg/dL (ref 8.6–10.4)
Chloride: 103 mmol/L (ref 98–110)
Creat: 1 mg/dL (ref 0.60–1.00)
Globulin: 3.2 g/dL (ref 1.9–3.7)
Glucose, Bld: 107 mg/dL — ABNORMAL HIGH (ref 65–99)
Potassium: 4 mmol/L (ref 3.5–5.3)
Sodium: 139 mmol/L (ref 135–146)
Total Bilirubin: 0.5 mg/dL (ref 0.2–1.2)
Total Protein: 7.8 g/dL (ref 6.1–8.1)
eGFR: 61 mL/min/{1.73_m2} (ref >=60)

## 2023-09-30 LAB — C3 AND C4
C3 Complement: 166 mg/dL (ref 83–193)
C4 Complement: 33 mg/dL (ref 15–57)

## 2023-09-30 LAB — CBC WITH DIFFERENTIAL/PLATELET
Absolute Lymphocytes: 1984 {cells}/uL (ref 850–3900)
Absolute Monocytes: 563 {cells}/uL (ref 200–950)
Basophils Absolute: 70 {cells}/uL (ref 0–200)
Basophils Relative: 1.1 %
Eosinophils Absolute: 83 {cells}/uL (ref 15–500)
Eosinophils Relative: 1.3 %
HCT: 44 % (ref 35.0–45.0)
Hemoglobin: 13.9 g/dL (ref 11.7–15.5)
MCH: 26.2 pg — ABNORMAL LOW (ref 27.0–33.0)
MCHC: 31.6 g/dL — ABNORMAL LOW (ref 32.0–36.0)
MCV: 82.9 fL (ref 80.0–100.0)
MPV: 10.7 fL (ref 7.5–12.5)
Monocytes Relative: 8.8 %
Neutro Abs: 3699 {cells}/uL (ref 1500–7800)
Neutrophils Relative %: 57.8 %
Platelets: 259 10*3/uL (ref 140–400)
RBC: 5.31 10*6/uL — ABNORMAL HIGH (ref 3.80–5.10)
RDW: 13.3 % (ref 11.0–15.0)
Total Lymphocyte: 31 %
WBC: 6.4 10*3/uL (ref 3.8–10.8)

## 2023-09-30 LAB — PROTEIN / CREATININE RATIO, URINE
Creatinine, Urine: 89 mg/dL (ref 20–275)
Protein/Creat Ratio: 101 mg/g{creat} (ref 24–184)
Protein/Creatinine Ratio: 0.101 mg/mg{creat} (ref 0.024–0.184)
Total Protein, Urine: 9 mg/dL (ref 5–24)

## 2023-09-30 LAB — ANTI-DNA ANTIBODY, DOUBLE-STRANDED: ds DNA Ab: <1 [IU]/mL

## 2023-09-30 LAB — SEDIMENTATION RATE: Sed Rate: 11 mm/h (ref 0–30)

## 2023-09-30 NOTE — Progress Notes (Signed)
 Urine protein creatinine ratio normal, complements normal, sed rate normal, dsDNA pending.

## 2023-09-30 NOTE — Progress Notes (Signed)
 CBC and CMP are stable.

## 2023-10-01 NOTE — Progress Notes (Signed)
Double stranded DNA negative.

## 2024-01-12 ENCOUNTER — Other Ambulatory Visit: Payer: Self-pay | Admitting: Family Medicine

## 2024-01-12 DIAGNOSIS — Z1231 Encounter for screening mammogram for malignant neoplasm of breast: Secondary | ICD-10-CM

## 2024-01-25 ENCOUNTER — Ambulatory Visit
Admission: RE | Admit: 2024-01-25 | Discharge: 2024-01-25 | Disposition: A | Source: Ambulatory Visit | Attending: Family Medicine | Admitting: Family Medicine

## 2024-01-25 DIAGNOSIS — Z1231 Encounter for screening mammogram for malignant neoplasm of breast: Secondary | ICD-10-CM

## 2024-01-26 HISTORY — PX: COLONOSCOPY: SHX174

## 2024-03-20 NOTE — Progress Notes (Signed)
 Office Visit Note  Patient: Stacy Dickerson             Date of Birth: 04/04/1956           MRN: [id number]             PCP: Verena Mems, MD Referring: Verena Mems, MD Visit Date: 03/30/2024 Occupation: Data Unavailable  Subjective:  Pain in joints  History of Present Illness: Stacy Dickerson is a 68 y.o. female with cutaneous lupus and osteoarthritis.  She returns today after her last visit in June 2025.  She states she continues to have pain and discomfort in her both hands, her hips and her knee joints.  She has not noticed any joint swelling.  She has noticed some itching in her lower back and she is not sure if it is a breakout of cutaneous lupus.  She has an appointment coming up with Dr. Dietrich.  She takes Plaquenil 200 mg p.o. twice daily without any interruption.  She states she had an eye examination this year.  She also has been using topical Rogaine without much relief.  She has been on minoxidil and biotin per Dr. Mollie.  She had intralesional cortisone injections by Dr. Mollie in May 2025.  She continues to have difficulty climbing the stairs and getting up from the chair.  She has stiffness lasting all day.  There is no history of oral ulcers, nasal ulcers, malar rash, photosensitivity, Raynaud's or lymphadenopathy.    Activities of Daily Living:  Patient reports morning stiffness for all day. Patient Reports nocturnal pain.  Difficulty dressing/grooming: Denies Difficulty climbing stairs: Reports Difficulty getting out of chair: Reports Difficulty using hands for taps, buttons, cutlery, and/or writing: Denies  Review of Systems  Constitutional:  Positive for fatigue.  HENT:  Positive for mouth dryness. Negative for mouth sores.   Eyes:  Positive for dryness.  Respiratory:  Negative for shortness of breath.   Cardiovascular:  Negative for chest pain and palpitations.  Gastrointestinal:  Negative for blood in stool, constipation and diarrhea.  Endocrine:  Negative for increased urination.  Genitourinary:  Negative for involuntary urination.  Musculoskeletal:  Positive for joint pain, gait problem, joint pain, joint swelling, myalgias, muscle weakness, morning stiffness, muscle tenderness and myalgias.  Skin:  Positive for rash and hair loss. Negative for color change and sensitivity to sunlight.  Allergic/Immunologic: Negative for susceptible to infections.  Neurological:  Positive for dizziness. Negative for headaches.  Hematological:  Negative for swollen glands.  Psychiatric/Behavioral:  Positive for sleep disturbance. Negative for depressed mood. The patient is not nervous/anxious.     PMFS History:  Patient Active Problem List   Diagnosis Date Noted   Hypertension 06/04/2023   Diabetes mellitus with coincident hypertension (HCC) 06/03/2022   Morbid obesity (HCC) 07/19/2020   Mixed hyperlipidemia 05/03/2020   Systemic lupus erythematosus (HCC) 05/03/2020    Past Medical History:  Diagnosis Date   Allergic rhinitis, unspecified    Arthritis    BMI 40.0-44.9, adult (HCC)    Chronic fatigue    Diabetes mellitus without complication (HCC)    Diverticulitis    Hyperlipidemia    Hypertension    IBS (irritable bowel syndrome)    Lupus    Major depressive disorder, recurrent episode, moderate (HCC)    Mild nonproliferative diabetic retinopathy of both eyes without macular edema (HCC)    Morbid obesity (HCC)    SLE (systemic lupus erythematosus related syndrome) (HCC)     Family History  Problem Relation Age of Onset   Breast cancer Mother 40   Cancer - Lung Mother    Alzheimer's disease Mother    Diabetes Mother    Hypertension Mother    Throat cancer Father    Prostate cancer Father    Past Surgical History:  Procedure Laterality Date   BREAST BIOPSY Left 2015   US  guided   CHOLECYSTECTOMY     COLONOSCOPY  01/2024   DIAGNOSTIC LAPAROSCOPY     MYOMECTOMY     TONSILLECTOMY     Social History   Tobacco Use    Smoking status: Never    Passive exposure: Past   Smokeless tobacco: Never  Vaping Use   Vaping status: Never Used  Substance Use Topics   Alcohol use: Yes    Alcohol/week: 1.0 standard drink of alcohol    Types: 1 Standard drinks or equivalent per week    Comment: once yearly   Drug use: Never   Social History   Social History Narrative   Not on file      There is no immunization history on file for this patient.   Objective: Vital Signs: BP 131/83   Pulse 83   Temp 98 F (36.7 C)   Resp 17   Ht 5' 6 (1.676 m)   Wt 260 lb 9.6 oz (118.2 kg)   BMI 42.06 kg/m    Physical Exam Vitals and nursing note reviewed.  Constitutional:      Appearance: She is well-developed.  HENT:     Head: Normocephalic and atraumatic.  Eyes:     Conjunctiva/sclera: Conjunctivae normal.  Cardiovascular:     Rate and Rhythm: Normal rate and regular rhythm.     Heart sounds: Normal heart sounds.  Pulmonary:     Effort: Pulmonary effort is normal.     Breath sounds: Normal breath sounds.  Abdominal:     General: Bowel sounds are normal.     Palpations: Abdomen is soft.  Musculoskeletal:     Cervical back: Normal range of motion.  Lymphadenopathy:     Cervical: No cervical adenopathy.  Skin:    General: Skin is warm and dry.     Capillary Refill: Capillary refill takes less than 2 seconds.  Neurological:     Mental Status: She is alert and oriented to person, place, and time.  Psychiatric:        Behavior: Behavior normal.      Musculoskeletal Exam: Cervical spine was in good range of motion.  Lumbar spine range of motion was difficult to assess due to body habitus.  Shoulders, elbows, wrist joints with good range of motion.  There was no tenderness over MCPs PIPs or DIPs.  She had right ring finger Dupuytren's contracture.  She had limited painful range of motion of her left hip joint.  Knee joints in good range of motion with discomfort.  There was no tenderness over ankles or  MTPs.  CDAI Exam: CDAI Score: -- Patient Global: --; Provider Global: -- Swollen: --; Tender: -- Joint Exam 03/30/2024   No joint exam has been documented for this visit   There is currently no information documented on the homunculus. Go to the Rheumatology activity and complete the homunculus joint exam.  Investigation: No additional findings.  Imaging: No results found.  Recent Labs: Lab Results  Component Value Date   WBC 6.4 09/29/2023   HGB 13.9 09/29/2023   PLT 259 09/29/2023   NA 139 09/29/2023   K 4.0  09/29/2023   CL 103 09/29/2023   CO2 26 09/29/2023   GLUCOSE 107 (H) 09/29/2023   BUN 23 09/29/2023   CREATININE 1.00 09/29/2023   BILITOT 0.5 09/29/2023   ALKPHOS 92 03/30/2018   AST 19 09/29/2023   ALT 17 09/29/2023   PROT 7.8 09/29/2023   ALBUMIN 4.4 03/30/2018   CALCIUM 9.8 09/29/2023   GFRAA 71 05/03/2020    Speciality Comments: Ophthalmologist Dr. Milissa [facility]) 11/11/22 PLQ eye exam  Procedures:  No procedures performed Allergies: Ciprofloxacin, Keflex [cephalexin], Lisinopril, and Sulfa antibiotics   Assessment / Plan:     Visit Diagnoses: Cutaneous lupus erythematosus - April 07, 2022 skin biopsy positive for cutaneous lupus.  ANA negative, ENA negative, complements normal. -Patient denies any being a rash.  She states she has been having some itching on her lower back at times.  She denies any history of oral ulcers, nasal ulcers, sicca symptoms, malar rash, photosensitivity, Raynaud's or inflammatory arthritis.  I will check labs today.  Plan: Protein / creatinine ratio, urine, ANA, Anti-DNA antibody, double-stranded, C3 and C4, Sedimentation rate  High risk medication use - by Dr. McCarthy,hydroxychloroquine 200 mg p.o. twice daily in October 2023.  Patient states she had eye examination this year.- Plan: CBC with Differential/Platelet, Comprehensive metabolic panel with GFR  Hair loss-she has been followed by Dr. Mollie.  She has been  using topical Rogaine 5% solution.  She is also taking minoxidil.  She states she still has not noticed much improvement in hair thinning.  She will follow-up with Dr. Mollie.  Pain in left hip -previous x-rays showed bilateral moderate osteoarthritic changes.  Degenerative changes were also noted in the lumbar spine.  She continues to have discomfort and limited range of motion of her left hip joint.  I will refer her to orthopedics for evaluation.  Will also refer to physical therapy for patient's request.  Primary osteoarthritis of both hips - Plan: Ambulatory referral to Physical Therapy, AMB referral to orthopedics  Primary osteoarthritis of both knees -she continues to have discomfort in her knee joints.  Previous x-rays showed moderate to severe osteoarthritis.  Patient states she is not mentally prepared for surgery.  She does not get long-lasting relief from viscosupplement injections.  I will refer her to orthopedics for evaluation.  I will also refer her to physical therapy.  S/p euflexxa bilateral knees 07/2023. - Plan: Ambulatory referral to Physical Therapy, AMB referral to orthopedics  Dupuytren's contracture of right hand - Right ring finger.  Unchanged and not symptomatic.  Other fatigue-need for regular exercise was emphasized.  Mixed hyperlipidemia  Other irritable bowel syndrome  Primary hypertension-blood pressure was normal today.  Anxiety and depression  Diabetes mellitus with coincident hypertension (HCC) - patient is followed by Dr. Tommas.  Vitamin D  deficiency  Osteopenia of multiple sites-August 09, 2023 T-score -1.4, BMD 0.843 right femoral neck.  Calcium rich diet and vitamin D  was discussed.  Regular exercise was emphasized.  Morbid (severe) obesity due to excess calories (HCC)-patient is working on weight loss with Dr. Tommas.  Orders: Orders Placed This Encounter  Procedures   Protein / creatinine ratio, urine   CBC with Differential/Platelet    Comprehensive metabolic panel with GFR   ANA   Anti-DNA antibody, double-stranded   C3 and C4   Sedimentation rate   Ambulatory referral to Physical Therapy   AMB referral to orthopedics   No orders of the defined types were placed in this encounter.   Follow-Up Instructions:  Return in about 6 months (around 09/28/2024) for Osteoarthritis, Systemic lupus.   Maya Nash, MD  Note - This record has been created using Animal nutritionist.  Chart creation errors have been sought, but may not always  have been located. Such creation errors do not reflect on  the standard of medical care.

## 2024-03-30 ENCOUNTER — Ambulatory Visit: Attending: Rheumatology | Admitting: Rheumatology

## 2024-03-30 ENCOUNTER — Encounter: Payer: Self-pay | Admitting: Rheumatology

## 2024-03-30 VITALS — BP 131/83 | HR 83 | Temp 98.0°F | Resp 17 | Ht 66.0 in | Wt 260.6 lb

## 2024-03-30 DIAGNOSIS — K588 Other irritable bowel syndrome: Secondary | ICD-10-CM

## 2024-03-30 DIAGNOSIS — F419 Anxiety disorder, unspecified: Secondary | ICD-10-CM

## 2024-03-30 DIAGNOSIS — Z79899 Other long term (current) drug therapy: Secondary | ICD-10-CM

## 2024-03-30 DIAGNOSIS — L659 Nonscarring hair loss, unspecified: Secondary | ICD-10-CM | POA: Diagnosis not present

## 2024-03-30 DIAGNOSIS — L932 Other local lupus erythematosus: Secondary | ICD-10-CM

## 2024-03-30 DIAGNOSIS — M160 Bilateral primary osteoarthritis of hip: Secondary | ICD-10-CM

## 2024-03-30 DIAGNOSIS — M25552 Pain in left hip: Secondary | ICD-10-CM

## 2024-03-30 DIAGNOSIS — M720 Palmar fascial fibromatosis [Dupuytren]: Secondary | ICD-10-CM

## 2024-03-30 DIAGNOSIS — E119 Type 2 diabetes mellitus without complications: Secondary | ICD-10-CM

## 2024-03-30 DIAGNOSIS — M170 Bilateral primary osteoarthritis of knee: Secondary | ICD-10-CM

## 2024-03-30 DIAGNOSIS — R5383 Other fatigue: Secondary | ICD-10-CM

## 2024-03-30 DIAGNOSIS — E782 Mixed hyperlipidemia: Secondary | ICD-10-CM

## 2024-03-30 DIAGNOSIS — F32A Depression, unspecified: Secondary | ICD-10-CM

## 2024-03-30 DIAGNOSIS — I10 Essential (primary) hypertension: Secondary | ICD-10-CM

## 2024-03-30 DIAGNOSIS — E559 Vitamin D deficiency, unspecified: Secondary | ICD-10-CM

## 2024-03-30 DIAGNOSIS — M8589 Other specified disorders of bone density and structure, multiple sites: Secondary | ICD-10-CM

## 2024-03-30 NOTE — Patient Instructions (Signed)
 Vaccines You are taking a medication(s) that can suppress your immune system.  The following immunizations are recommended: Flu annually Covid-19  RSV Td/Tdap (tetanus, diphtheria, pertussis) every 10 years Pneumonia (Prevnar 15 then Pneumovax 23 at least 1 year apart.  Alternatively, can take Prevnar 20 without needing additional dose) Shingrix: 2 doses from 4 weeks to 6 months apart  Please check with your PCP to make sure you are up to date.   Exercises for Chronic Knee Pain Chronic knee pain is pain that lasts longer than 3 months. For most people with chronic knee pain, exercise and weight loss is an important part of treatment. Your health care provider may want you to focus on: Making the muscles that support your knee stronger. This can take pressure off your knee and reduce pain. Preventing knee stiffness. How far you can move your knee, keeping it there or making it farther. Losing weight (if this applies) to take pressure off your knee, lower your risk for injury, and make it easier for you to exercise. Your provider will help you make an exercise program that fits your needs and physical abilities. Below are simple, low-impact exercises you can do at home. Ask your provider or physical therapist how often you should do your exercise program and how many times to repeat each exercise. General safety tips  Get your provider's approval before doing any exercises. Start slowly and stop any time you feel pain. Do not exercise if your knee pain is flaring up. Warm up first. Stretching a cold muscle can cause an injury. Do 5-10 minutes of easy movement or light stretching before beginning your exercises. Do 5-10 minutes of low-impact activity (like walking or cycling) before starting strengthening exercises. Contact your provider any time you have pain during or after exercising. Exercise can cause discomfort but should not be painful. It is normal to be a little stiff or sore after  exercising. Stretching and range-of-motion exercises Front thigh stretch  Stand up straight and support your body by holding on to a chair or resting one hand on a wall. With your legs straight and close together, bend one knee to lift your heel up toward your butt. Using one hand for support, grab your ankle with your free hand. Pull your foot up closer toward your butt to feel the stretch in front of your thigh. Hold the stretch for 30 seconds. Repeat __________ times. Complete this exercise __________ times a day. Back thigh stretch  Sit on the floor with your back straight and your legs out straight in front of you. Place the palms of your hands on the floor and slide them toward your feet as you bend at the hip. Try to touch your nose to your knees and feel the stretch in the back of your thighs. Hold for 30 seconds. Repeat __________ times. Complete this exercise __________ times a day. Calf stretch  Stand facing a wall. Place the palms of your hands flat against the wall, arms extended, and lean slightly against the wall. Get into a lunge position with one leg bent at the knee and the other leg stretched out straight behind you. Keep both feet facing the wall and increase the bend in your knee while keeping the heel of the other leg flat on the ground. You should feel the stretch in your calf. Hold for 30 seconds. Repeat __________ times. Complete this exercise __________ times a day. Strengthening exercises Straight leg lift  Lie on your back with one  knee bent and the other leg out straight. Slowly lift the straight leg without bending the knee. Lift until your foot is about 12 inches (30 cm) off the floor. Hold for 3-5 seconds and slowly lower your leg. Repeat __________ times. Complete this exercise __________ times a day. Single leg dip  Stand between two chairs and put both hands on the backs of the chairs for support. Extend one leg out straight with your body weight  resting on the heel of the standing leg. Slowly bend your standing knee to dip your body to the level that is comfortable for you. Hold for 3-5 seconds. Repeat __________ times. Complete this exercise __________ times a day. Hamstring curls  Stand straight, knees close together, facing the back of a chair. Hold on to the back of a chair with both hands. Keep one leg straight. Bend the other knee while bringing the heel up toward the butt until the knee is bent at a 90-degree angle (right angle). Hold for 3-5 seconds. Repeat __________ times. Complete this exercise __________ times a day. Wall squat  Stand straight with your back, hips, and head against a wall. Step forward one foot at a time with your back still against the wall. Your feet should be 2 feet (61 cm) from the wall at shoulder width. Keeping your back, hips, and head against the wall, slide down the wall to as close to a sitting position as you can get. Hold for 5-10 seconds, then slowly slide back up. Repeat __________ times. Complete this exercise __________ times a day. Step-ups  Stand in front of a sturdy platform or stool that is about 6 inches (15 cm) high. Slowly step up with your left / right foot, keeping your knee in line with your hip and foot. Do not let your knee bend so far that you cannot see your toes. Hold on to a chair for balance, but do not use it for support. Slowly unlock your knee and lower yourself to the starting position. Repeat __________ times. Complete this exercise __________ times a day. Contact a health care provider if: Your exercises cause pain. Your pain is worse after you exercise. Your pain prevents you from doing your exercises. This information is not intended to replace advice given to you by your health care provider. Make sure you discuss any questions you have with your health care provider. Document Revised: 04/28/2022 Document Reviewed: 04/28/2022 Elsevier Patient Education   2024 [employer]Hip Exercises Ask your health care provider which exercises are safe for you. Do exercises exactly as told by your provider and adjust them as told. It is normal to feel mild stretching, pulling, tightness, or discomfort as you do these exercises. Stop right away if you feel sudden pain or your pain gets worse. Do not begin these exercises until told by your provider. Stretching and range-of-motion exercises These exercises warm up your muscles and joints and improve the movement and flexibility of your hip. They also help to relieve pain, numbness, and tingling. You may be asked to limit your range of motion if you had a hip replacement. Talk to your provider about these limits. Hamstrings, supine  Lie on your back (supine position). Loop a belt, towel, or exercise band over the ball of your left / right foot. The ball of your foot is on the walking surface, right under your toes. Straighten your left / right knee and slowly pull on the belt, towel, or band to raise your leg until  you feel a gentle stretch behind your knee (hamstring). Do not let your knee bend while you do this. Keep your other leg flat on the floor. Hold this position for __________ seconds. Slowly return your leg to the starting position. Repeat __________ times. Complete this exercise __________ times a day. Hip rotation  Lie on your back on a firm surface. With your left / right hand, gently pull your left / right knee toward the shoulder that is on the same side of the body. Stop when your knee is pointing toward the ceiling. Hold your left / right ankle with your other hand. Keeping your knee steady, gently pull your left / right ankle toward your other shoulder until you feel a stretch in your butt. Keep your hips and shoulders firmly planted while you do this stretch. Hold this position for __________ seconds. Repeat __________ times. Complete this exercise __________ times a day. Seated  stretch This exercise is sometimes called hamstrings and adductors stretch. Sit on the floor with your legs stretched wide. Keep your knees straight during this exercise. Keeping your head and back in a straight line, bend at your waist to reach for your left foot (position A). You should feel a stretch in your right inner thigh (adductors). Hold this position for __________ seconds. Then slowly return to the upright position. Keeping your head and back in a straight line, bend at your waist to reach forward (position B). You should feel a stretch behind both of your thighs and knees (hamstrings). Hold this position for __________ seconds. Then slowly return to the upright position. Keeping your head and back in a straight line, bend at your waist to reach for your right foot (position C). You should feel a stretch in your left inner thigh (adductors). Hold this position for __________ seconds. Then slowly return to the upright position. Repeat __________ times. Complete this exercise __________ times a day. Lunge This exercise stretches the muscles of the hip (hip flexors). Place your left / right knee on the floor and bend your other knee so that is directly over your ankle. You should be half-kneeling. Keep good posture with your head over your shoulders. Tighten your butt muscles to point your tailbone downward. This will prevent your back from arching too much. You should feel a gentle stretch in the front of your left / right thigh and hip. If you do not feel a stretch, slide your other foot forward slightly and then slowly lunge forward with your chest up until your knee once again lines up over your ankle. Make sure your tailbone continues to point downward. Hold this position for __________ seconds. Slowly return to the starting position. Repeat __________ times. Complete this exercise __________ times a day. Strengthening exercises These exercises build strength and endurance in your  hip. Endurance is the ability to use your muscles for a long time, even after they get tired. Bridge This exercise strengthens the muscles of your hip (hip extensors). Lie on your back on a firm surface with your knees bent and your feet flat on the floor. Tighten your butt muscles and lift your bottom off the floor until the trunk of your body and your hips are level with your thighs. Do not arch your back. You should feel the muscles working in your butt and the back of your thighs. If you do not feel these muscles, slide your feet 1-2 inches (2.5-5 cm) farther away from your butt. Hold this position for __________ seconds.  Slowly lower your hips to the starting position. Let your muscles relax completely between repetitions. Repeat __________ times. Complete this exercise __________ times a day. Straight leg raises, side-lying This exercise strengthens the muscles that move the hip joint away from the center of the body (hip abductors). Lie on your side with your left / right leg in the top position. Lie so your head, shoulder, hip, and knee line up. You may bend your bottom knee slightly to help you balance. Roll your hips slightly forward, so your hips are stacked directly over each other and your left / right knee is facing forward. Leading with your heel, lift your top leg 4-6 inches (10-15 cm). You should feel the muscles in your top hip lifting. Do not let your foot drift forward. Do not let your knee roll toward the ceiling. Hold this position for __________ seconds. Slowly return to the starting position. Let your muscles relax completely between repetitions. Repeat __________ times. Complete this exercise __________ times a day. Straight leg raises, side-lying This exercise strengthens the muscles that move the hip joint toward the center of the body (hip adductors). Lie on your side with your left / right leg in the bottom position. Lie so your head, shoulder, hip, and knee line  up. You may place your upper foot in front to help you balance. Roll your hips slightly forward, so your hips are stacked directly over each other and your left / right knee is facing forward. Tense the muscles in your inner thigh and lift your bottom leg 4-6 inches (10-15 cm). Hold this position for __________ seconds. Slowly return to the starting position. Let your muscles relax completely between repetitions. Repeat __________ times. Complete this exercise __________ times a day. Straight leg raises, supine This exercise strengthens the muscles in the front of your thigh (quadriceps and hip flexors). Lie on your back (supine position) with your left / right leg extended and your other knee bent. Tense the muscles in the front of your left / right thigh. You should see your kneecap slide up or see increased dimpling just above your knee. Keep these muscles tight as you raise your leg 4-6 inches (10-15 cm) off the floor. Do not let your knee bend. Hold this position for __________ seconds. Keep these muscles tense as you lower your leg. Relax the muscles slowly and completely between repetitions. Repeat __________ times. Complete this exercise __________ times a day. Hip abductors, standing This exercise strengthens the muscles that move the leg and hip joint away from the center of the body (hip abductors). Tie one end of a rubber exercise band or tubing to a secure surface, such as a chair, table, or pole. Loop the other end of the band or tubing around your left / right ankle. Keeping your ankle with the band or tubing directly opposite the secured end, step away until there is tension in the tubing or band. Hold on to a chair, table, or pole as needed for balance. Lift your left / right leg out to your side. While you do this: Keep your back upright. Keep your shoulders over your hips. Keep your toes pointing forward. Make sure to use your hip muscles to slowly lift your leg. Do not  tip your body or forcefully lift your leg. Hold this position for __________ seconds. Slowly return to the starting position. Repeat __________ times. Complete this exercise __________ times a day. Squats This exercise strengthens the muscles in the front of your  thigh (quadriceps). Stand in front of a table, or stand in a doorframe so your feet and knees are in line with the frame. You may place your hands on the table or frame for balance. Slowly bend your knees and lower your hips like you are going to sit in a chair. Keep your lower legs in a straight up-and-down position. Do not let your hips go lower than your knees. Do not bend your knees lower than told by your provider. If your hip pain increases, do not bend as low. Hold this position for ___________ seconds. Slowly push with your legs to return to standing. Do not use your hands to pull yourself to standing. Repeat __________ times. Complete this exercise __________ times a day. This information is not intended to replace advice given to you by your health care provider. Make sure you discuss any questions you have with your health care provider. Document Revised: 12/16/2021 Document Reviewed: 12/16/2021 Elsevier Patient Education  2024 [employer].

## 2024-03-31 LAB — CBC WITH DIFFERENTIAL/PLATELET
Absolute Lymphocytes: 1549 {cells}/uL (ref 850–3900)
Absolute Monocytes: 499 {cells}/uL (ref 200–950)
Basophils Absolute: 52 {cells}/uL (ref 0–200)
Basophils Relative: 0.9 %
Eosinophils Absolute: 139 {cells}/uL (ref 15–500)
Eosinophils Relative: 2.4 %
HCT: 40.4 % (ref 35.9–46.0)
Hemoglobin: 13.2 g/dL (ref 11.7–15.5)
MCH: 26.7 pg — ABNORMAL LOW (ref 27.0–33.0)
MCHC: 32.7 g/dL (ref 31.6–35.4)
MCV: 81.6 fL (ref 81.4–101.7)
MPV: 10.6 fL (ref 7.5–12.5)
Monocytes Relative: 8.6 %
Neutro Abs: 3561 {cells}/uL (ref 1500–7800)
Neutrophils Relative %: 61.4 %
Platelets: 271 Thousand/uL (ref 140–400)
RBC: 4.95 Million/uL (ref 3.80–5.10)
RDW: 12.9 % (ref 11.0–15.0)
Total Lymphocyte: 26.7 %
WBC: 5.8 Thousand/uL (ref 3.8–10.8)

## 2024-03-31 LAB — COMPREHENSIVE METABOLIC PANEL WITH GFR
AG Ratio: 1.4 (calc) (ref 1.0–2.5)
ALT: 11 U/L (ref 6–29)
AST: 15 U/L (ref 10–35)
Albumin: 4.2 g/dL (ref 3.6–5.1)
Alkaline phosphatase (APISO): 120 U/L (ref 37–153)
BUN: 16 mg/dL (ref 7–25)
CO2: 26 mmol/L (ref 20–32)
Calcium: 9.4 mg/dL (ref 8.6–10.4)
Chloride: 103 mmol/L (ref 98–110)
Creat: 0.97 mg/dL (ref 0.60–1.00)
Globulin: 3 g/dL (ref 1.9–3.7)
Glucose, Bld: 153 mg/dL — ABNORMAL HIGH (ref 65–99)
Potassium: 3.8 mmol/L (ref 3.5–5.3)
Sodium: 139 mmol/L (ref 135–146)
Total Bilirubin: 0.6 mg/dL (ref 0.2–1.2)
Total Protein: 7.2 g/dL (ref 6.1–8.1)
eGFR: 63 mL/min/1.73m2 (ref >=60)

## 2024-03-31 LAB — ANA: Anti Nuclear Antibody (ANA): NEGATIVE

## 2024-03-31 LAB — PROTEIN / CREATININE RATIO, URINE
Creatinine, Urine: 48 mg/dL (ref 20–275)
Protein/Creat Ratio: 146 mg/g{creat} (ref 24–184)
Protein/Creatinine Ratio: 0.146 mg/mg{creat} (ref 0.024–0.184)
Total Protein, Urine: 7 mg/dL (ref 5–24)

## 2024-03-31 LAB — ANTI-DNA ANTIBODY, DOUBLE-STRANDED: ds DNA Ab: <1 [IU]/mL

## 2024-03-31 LAB — C3 AND C4
C3 Complement: 148 mg/dL (ref 83–193)
C4 Complement: 30 mg/dL (ref 15–57)

## 2024-03-31 LAB — SEDIMENTATION RATE: Sed Rate: 19 mm/h (ref 0–30)

## 2024-04-02 ENCOUNTER — Ambulatory Visit: Payer: Self-pay | Admitting: Rheumatology

## 2024-04-02 NOTE — Progress Notes (Signed)
 CBC normal, CMP shows elevated glucose at 153 urine protein creatinine ratio normal, ANA negative, double-stranded DNA negative, complements normal, sed rate normal.  Labs do not indicate an autoimmune disease flare.

## 2024-04-13 NOTE — Therapy (Signed)
 OUTPATIENT PHYSICAL THERAPY LOWER EXTREMITY EVALUATION   Patient Name: Stacy Dickerson MRN: [id number] DOB:January 26, 1956, 68 y.o., female Today's Date: 04/13/2024  END OF SESSION:   Past Medical History:  Diagnosis Date   Allergic rhinitis, unspecified    Arthritis    BMI 40.0-44.9, adult (HCC)    Chronic fatigue    Diabetes mellitus without complication (HCC)    Diverticulitis    Hyperlipidemia    Hypertension    IBS (irritable bowel syndrome)    Lupus    Major depressive disorder, recurrent episode, moderate (HCC)    Mild nonproliferative diabetic retinopathy of both eyes without macular edema (HCC)    Morbid obesity (HCC)    SLE (systemic lupus erythematosus related syndrome) (HCC)    Past Surgical History:  Procedure Laterality Date   BREAST BIOPSY Left 2015   US  guided   CHOLECYSTECTOMY     COLONOSCOPY  01/2024   DIAGNOSTIC LAPAROSCOPY     MYOMECTOMY     TONSILLECTOMY     Patient Active Problem List   Diagnosis Date Noted   Hypertension 06/04/2023   Diabetes mellitus with coincident hypertension (HCC) 06/03/2022   Morbid obesity (HCC) 07/19/2020   Mixed hyperlipidemia 05/03/2020   Systemic lupus erythematosus (HCC) 05/03/2020    PCP: Reena Bucco, MD   REFERRING PROVIDER: Maya Nash, MD   REFERRING DIAG: M16.0 (ICD-10-CM) - Primary osteoarthritis of both hips M17.0 (ICD-10-CM) - Primary osteoarthritis of both knees  THERAPY DIAG:  No diagnosis found.  Rationale for Evaluation and Treatment: Rehabilitation  ONSET DATE: ***  SUBJECTIVE:   SUBJECTIVE STATEMENT: ***  PERTINENT HISTORY: *** PAIN:  Are you having pain? Yes: NPRS scale: *** Pain location: *** Pain description: *** Aggravating factors: *** Relieving factors: ***  PRECAUTIONS: {Therapy precautions:24002}  RED FLAGS: {PT Red Flags:29287}   WEIGHT BEARING RESTRICTIONS: No  FALLS:  Has patient fallen in last 6 months? {fallsyesno:27318}  LIVING ENVIRONMENT: Lives with:  {OP[facility] lives with:25569::lives with their family} Lives in: {Lives in:25570} Stairs: {opstairs:27293} Has following equipment at home: {Assistive devices:23999}  OCCUPATION: ***  PLOF: {PLOF:24004}  PATIENT GOALS: ***  NEXT MD VISIT: ***  OBJECTIVE:  Note: Objective measures were completed at Evaluation unless otherwise noted.  DIAGNOSTIC FINDINGS:  Moderate narrowing of bilateral hip joints was noted.  More prominent on  the left side.  No SI joint narrowing or sclerosis was noted.   Degenerative changes were noted in the lumbar spine. Impression: Moderate osteoarthritis was noted in bilateral hips.   Lt knee: Moderate to severe medial compartment narrowing with medial and  intercondylar osteophytes was noted.  Moderate patellofemoral narrowing  was noted. Impression: These findings are suggestive of moderate to severe  osteoarthritis and moderate chondromalacia patella.   Rt knee:Moderate medial compartment narrowing and mild patellofemoral narrowing  was noted.  No chondrocalcinosis was noted. Impression: These findings suggestive of moderate osteoarthritis and mild  chondromalacia patella.   PATIENT SURVEYS:  PSFS: THE PATIENT SPECIFIC FUNCTIONAL SCALE  Place score of 0-10 (0 = unable to perform activity and 10 = able to perform activity at the same level as before injury or problem)  Activity Date: 04/14/2024         2.     3.     4.      Total Score ***      Total Score = Sum of activity scores/number of activities  Minimally Detectable Change: 3 points (for single activity); 2 points (for average score)  [facility] (nd).  The Patient Specific Functional Scale . Retrieved from [email or web address]   COGNITION: Overall cognitive status: {cognition:24006}     SENSATION: {sensation:27233}  EDEMA:  {edema:24020}  MUSCLE LENGTH: Hamstrings: Right *** deg; Left *** deg Debby test: Right ***  deg; Left *** deg  POSTURE: {posture:25561}  PALPATION: ***  LOWER EXTREMITY ROM:  {AROM/PROM:27142} ROM Right Eval 04/14/2024 Left Eval 04/14/2024  Hip flexion    Hip extension    Hip abduction    Hip adduction    Hip internal rotation    Hip external rotation    Knee flexion    Knee extension    Ankle dorsiflexion    Ankle plantarflexion    Ankle inversion    Ankle eversion     (Blank rows = not tested)  LOWER EXTREMITY MMT:  MMT Right Eval 04/14/2024 Left Eval 04/14/2024  Hip flexion    Hip extension    Hip abduction    Hip adduction    Hip internal rotation    Hip external rotation    Knee flexion    Knee extension    Ankle dorsiflexion    Ankle plantarflexion    Ankle inversion    Ankle eversion     (Blank rows = not tested)  LOWER EXTREMITY SPECIAL TESTS:  {LEspecialtests:26242}  FUNCTIONAL TESTS:  {Functional tests:24029}  GAIT: Distance walked: *** Assistive device utilized: {Assistive devices:23999} Level of assistance: {Levels of assistance:24026} Comments: ***                                                                                                                                TREATMENT DATE:  04/14/2024 TherEx:  HEP handout provided with patient performing one set of each activity for appropriate form. Verbal and tactile cues provided.   Self-Care:  POC, ***   PATIENT EDUCATION:  Education details: *** Person educated: {Person educated:25204} Education method: {Education Method:25205} Education comprehension: {Education Comprehension:25206}  HOME EXERCISE PROGRAM: ***  ASSESSMENT:  CLINICAL IMPRESSION: Patient is a 68 y.o. F who was seen today for physical therapy evaluation and treatment for bilat hip and bilat knee pain presenting with ***. Patient will benefit from skilled PT to address above noted deficits.   OBJECTIVE IMPAIRMENTS: {opptimpairments:25111}.   ACTIVITY LIMITATIONS:  {activitylimitations:27494}  PARTICIPATION LIMITATIONS: {participationrestrictions:25113}  PERSONAL FACTORS: {Personal factors:25162} are also affecting patient's functional outcome.   REHAB POTENTIAL: {rehabpotential:25112}  CLINICAL DECISION MAKING: {clinical decision making:25114}  EVALUATION COMPLEXITY: {Evaluation complexity:25115}   GOALS: Goals reviewed with patient? Yes  SHORT TERM GOALS: Target date: *** Patient will show compliance with initial HEP. Baseline: Goal status: INITIAL  2.  Patient will report pain levels no greater than ***/10 in order to show an improvement in overall quality of life. Baseline:  Goal status: INITIAL  3.  *** Baseline:  Goal status: INITIAL  4.  *** Baseline:  Goal status: INITIAL  5.  *** Baseline:  Goal status: INITIAL  6.  *** Baseline:  Goal status: INITIAL  LONG TERM GOALS: Target date: ***  Patient will be independent with final HEP in order to maintain and progress upon functional gains made within PT. Baseline:  Goal status: INITIAL  2.  Patient will report pain levels no greater than ***/10 in order to show an improvement in overall quality of life. Baseline:  Goal status: INITIAL  3.  Patient will increase PSFS to at least *** in order to show a significant improvement in subjective disability rating. Baseline:  Goal status: INITIAL  4.  *** Baseline:  Goal status: INITIAL  5.  *** Baseline:  Goal status: INITIAL  6.  *** Baseline:  Goal status: INITIAL   PLAN:  PT FREQUENCY: {rehab frequency:25116}  PT DURATION: {rehab duration:25117}  PLANNED INTERVENTIONS: 97164- PT Re-evaluation, 97750- Physical Performance Testing, 97110-Therapeutic exercises, 97530- Therapeutic activity, [id number]- Neuromuscular re-education, 97535- Self Care, 02859- Manual therapy, [id number]- Gait training, [id number]- Orthotic Initial, [id number]- Orthotic/Prosthetic subsequent, [id number]- Canalith repositioning, [id number]- Aquatic Therapy,  H9716- Electrical stimulation (unattended), [phone]- Electrical stimulation (manual), [id number]- Vasopneumatic device, [id number]- Ultrasound, [id number]- Traction (mechanical), [id number]- Ionotophoresis 4mg /ml Dexamethasone, 79439 (1-2 muscles), 20561 (3+ muscles)- Dry Needling, Patient/Family education, Balance training, Stair training, Taping, Joint mobilization, Joint manipulation, Spinal manipulation, Spinal mobilization, Vestibular training, DME instructions, Cryotherapy, and Moist heat  PLAN FOR NEXT SESSION: ***   Susannah Daring, PT, DPT 04/13/2024 11:25 AM

## 2024-04-14 ENCOUNTER — Ambulatory Visit

## 2024-04-14 DIAGNOSIS — M25562 Pain in left knee: Secondary | ICD-10-CM | POA: Diagnosis not present

## 2024-04-14 DIAGNOSIS — M25561 Pain in right knee: Secondary | ICD-10-CM

## 2024-04-14 DIAGNOSIS — R2689 Other abnormalities of gait and mobility: Secondary | ICD-10-CM | POA: Diagnosis not present

## 2024-04-14 DIAGNOSIS — M25551 Pain in right hip: Secondary | ICD-10-CM

## 2024-04-14 DIAGNOSIS — M5459 Other low back pain: Secondary | ICD-10-CM

## 2024-04-14 DIAGNOSIS — M6281 Muscle weakness (generalized): Secondary | ICD-10-CM

## 2024-04-14 DIAGNOSIS — G8929 Other chronic pain: Secondary | ICD-10-CM

## 2024-04-14 DIAGNOSIS — R2681 Unsteadiness on feet: Secondary | ICD-10-CM | POA: Diagnosis not present

## 2024-04-14 DIAGNOSIS — M25552 Pain in left hip: Secondary | ICD-10-CM | POA: Diagnosis not present

## 2024-04-26 ENCOUNTER — Ambulatory Visit: Admitting: Orthopaedic Surgery

## 2024-04-26 ENCOUNTER — Other Ambulatory Visit

## 2024-04-26 VITALS — Ht 66.0 in | Wt 259.0 lb

## 2024-04-26 DIAGNOSIS — M25561 Pain in right knee: Secondary | ICD-10-CM

## 2024-04-26 DIAGNOSIS — G8929 Other chronic pain: Secondary | ICD-10-CM

## 2024-04-26 MED ORDER — METHYLPREDNISOLONE ACETATE 40 MG/ML IJ SUSP
40.0000 mg | INTRAMUSCULAR | Status: AC | PRN
  Administered 2024-04-26: 40 mg via INTRA_ARTICULAR

## 2024-04-26 MED ORDER — LIDOCAINE HCL 1 % IJ SOLN
3.0000 mL | INTRAMUSCULAR | Status: AC | PRN
  Administered 2024-04-26: 3 mL

## 2024-04-26 NOTE — Progress Notes (Signed)
 The patient is a 68 year old female that I am seeing for the first time.  She comes in mainly today for her right knee.  She had traveled a couple months ago to [place] and did a lot of walking.  Her knee is really been hurting her quite a bit since then.  There are x-rays of her hips and knees on the canopy system.  Her right knee is what hurts her the most.  We did obtain new x-rays today since her pain did become acute.  She is a type II diabetic and does watch her blood glucose closely.  She has someone who does have a BMI 41.8 and she is trying to work on weight loss.  I was able to review her past medical history and medications within epic.  She is followed closely by Dr. Dolphus who has sent her our way.  She also is on Plaquenil.  Examination of her right knee does show some varus malalignment.  There is no effusion but there is definitely patellofemoral crepitation and medial joint line tenderness.  There is some lateral tenderness as well as a positive McMurray's sign to the lateral compartment of the knee.  Both her knees hyperextend.  An AP and lateral the right knee today shows significant patellofemoral narrowing.  The medial lateral compartments are still well-maintained.  I did review x-rays of her hips and her left knee and her left knee shows some arthritic changes in terms of joint space narrowing and patellofemoral narrowing.  The hips are well-maintained which is some slight narrowing.  I did recommend a steroid injection in her right knee today to calm down the acute pain that she has been experiencing from her trip and she agreed to this and tolerated well knowing that she will watch her blood glucose closely close there is a high likelihood this will elevate her blood sugars.  She will watch her carbohydrate intake as well.  We do need to obtain an MRI of her right knee at this point given the locking catching of her knee to rule out a meniscal tear and to assess the cartilage.   We will see her back in follow-up once we have this MRI.  She agrees with this treatment plan.    Procedure Note  Patient: Stacy Dickerson             Date of Birth: [birth year]           MRN: [id number]             Visit Date: 04/26/2024  Procedures: Visit Diagnoses:  1. Chronic pain of right knee     Large Joint Inj: R knee on 04/26/2024 3:00 PM Indications: diagnostic evaluation and pain Details: 22 G 1.5 in needle, superolateral approach  Arthrogram: No  Medications: 3 mL lidocaine  1 %; 40 mg methylPREDNISolone acetate 40 MG/ML Outcome: tolerated well, no immediate complications Procedure, treatment alternatives, risks and benefits explained, specific risks discussed. Consent was given by the patient. Immediately prior to procedure a time out was called to verify the correct patient, procedure, equipment, support staff and site/side marked as required. Patient was prepped and draped in the usual sterile fashion.

## 2024-04-28 ENCOUNTER — Other Ambulatory Visit: Payer: Self-pay

## 2024-04-28 DIAGNOSIS — G8929 Other chronic pain: Secondary | ICD-10-CM

## 2024-05-02 NOTE — Therapy (Signed)
 " OUTPATIENT PHYSICAL THERAPY LOWER EXTREMITY TREATMENT   Patient Name: Stacy Dickerson MRN: [id number] DOB:1955-11-28, 69 y.o., female Today's Date: 05/03/2024  END OF SESSION:  PT End of Session - 05/03/24 1255     Visit Number 2    Number of Visits 20    Date for Recertification  06/23/24    Authorization Type [employer] $25 COPAY, 60 VL    PT Start Time 1303    PT Stop Time 1343    PT Time Calculation (min) 40 min    Activity Tolerance Patient limited by pain    Behavior During Therapy WFL for tasks assessed/performed           Past Medical History:  Diagnosis Date   Allergic rhinitis, unspecified    Arthritis    BMI 40.0-44.9, adult (HCC)    Chronic fatigue    Diabetes mellitus without complication (HCC)    Diverticulitis    Hyperlipidemia    Hypertension    IBS (irritable bowel syndrome)    Lupus    Major depressive disorder, recurrent episode, moderate (HCC)    Mild nonproliferative diabetic retinopathy of both eyes without macular edema (HCC)    Morbid obesity (HCC)    SLE (systemic lupus erythematosus related syndrome) (HCC)    Past Surgical History:  Procedure Laterality Date   BREAST BIOPSY Left 2015   US  guided   CHOLECYSTECTOMY     COLONOSCOPY  01/2024   DIAGNOSTIC LAPAROSCOPY     MYOMECTOMY     TONSILLECTOMY     Patient Active Problem List   Diagnosis Date Noted   Hypertension 06/04/2023   Diabetes mellitus with coincident hypertension (HCC) 06/03/2022   Morbid obesity (HCC) 07/19/2020   Mixed hyperlipidemia 05/03/2020   Systemic lupus erythematosus (HCC) 05/03/2020    PCP: Reena Bucco, MD   REFERRING PROVIDER: Maya Nash, MD   REFERRING DIAG: M16.0 (ICD-10-CM) - Primary osteoarthritis of both hips M17.0 (ICD-10-CM) - Primary osteoarthritis of both knees  THERAPY DIAG:  Chronic pain of both knees  Pain of both hip joints  Other low back pain  Muscle weakness (generalized)  Other abnormalities of gait and mobility  Unsteadiness  on feet  Rationale for Evaluation and Treatment: Rehabilitation  ONSET DATE: chronic, though exacerbated in October   SUBJECTIVE:   SUBJECTIVE STATEMENT: Patient reports having received an injection in the Rt knee that has assisted with her pain, though her Lt knee is now hurting more. Patient reports no change with low back, but only hurts with standing and walking.  PERTINENT HISTORY: Patient reports chronic bilat knee, bilat hip, and low back pain. Patient reports pain worsened in October after a trip that involved an increased amount of walking leading to worsening Rt knee pain. Patient endorses having received injections in bilat knees in the past (with last being in Mat/June of last year). Patient reports no other injuries, trauma, or surgeries to bilat knees, bilat hips, or low back. Patient endorses difficulty with pain feeling like it is in the whole leg, knees feeling like they will give out while going up and down stairs, and stiffness when sitting for too long.   See PMH or personal factors for in depth comorbidities   PAIN:  Are you having pain? Yes: NPRS scale: Rt knee 2/10, Lt knee 1/10, low back 8.5/10  Pain location: Rt knee>Lt knee, Lt hip, low back  Pain description: aching all the time with intermittent sharp shooting pain  Aggravating factors: standing from low seats, sleeping,  mobility after sitting for too long, being on knees    Relieving factors: stretching helps with stiffness, analgesic ointment (only helped with muscle pain)   PRECAUTIONS: Fall  RED FLAGS: Bowel or bladder incontinence: Yes: not spinal cord related; being followed by gastro and Cauda equina syndrome: No   WEIGHT BEARING RESTRICTIONS: No  FALLS:  Has patient fallen in last 6 months? 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 Vernetta for first time   OBJECTIVE:  Note: Objective measures were completed at Evaluation unless otherwise noted.  DIAGNOSTIC FINDINGS:  Moderate narrowing of bilateral hip joints was noted.  More prominent on  the left side.  No SI joint narrowing or sclerosis was noted.   Degenerative changes were noted in the lumbar spine. Impression: Moderate osteoarthritis was noted in bilateral hips.   Lt knee: Moderate to severe medial compartment narrowing with medial and  intercondylar osteophytes was noted.  Moderate patellofemoral narrowing  was noted. Impression: These findings are suggestive of moderate to severe  osteoarthritis and moderate chondromalacia patella.   Rt knee:Moderate medial compartment narrowing and mild patellofemoral narrowing  was noted.  No chondrocalcinosis was noted. Impression: These findings suggestive of moderate osteoarthritis and mild  chondromalacia patella.   PATIENT SURVEYS:  PSFS: THE PATIENT SPECIFIC FUNCTIONAL SCALE  Place score of 0-10 (0 = unable to perform activity and 10 = able to perform activity at the same level as before injury or problem)  Activity Date: 04/14/2024    Walking up and down stairs  3    2. Standing from sofa and armless chair  3    3. Walking  4    4.  Sleeping comfortably  7    Total Score 4.25      Total Score = Sum of activity scores/number of activities  Minimally Detectable Change: 3 points (for single activity); 2 points (for average score)  [facility] (nd). The Patient Specific Functional Scale . Retrieved from  [email or web address]   COGNITION: Overall cognitive status: Within functional limits for tasks assessed     SENSATION: Light touch: WFL Patient endorses n/t in big toe on Lt foot secondary to neuropathy; lying down leads to n/t in bottom of bilat feet    MUSCLE LENGTH: Not objectively measured, though tightness noted with supine SLR   POSTURE: rounded shoulders, forward head, and increased thoracic kyphosis  PALPATION: TTP over sacrum and bilat PSIS   LOWER EXTREMITY ROM:  Passive ROM Right Eval 04/14/2024 Left Eval 04/14/2024  Hip flexion (supine) [facility] WFL  Hip extension    Hip abduction    Hip adduction    Hip internal rotation (supine) WFL Limited 75% and highly painful  Hip external rotation (supine) [facility] WFL  Knee flexion (supine) [facility] WFL  Knee extension (supine) [facility] WFL  Ankle dorsiflexion    Ankle plantarflexion    Ankle inversion    Ankle eversion     (Blank rows = not tested)  LOWER EXTREMITY MMT:  MMT Right Eval  04/14/2024 Left Eval 04/14/2024  Hip flexion (seated) 3/5, painful in hip 3+/5  Hip extension (prone) 2+/5, painful in low back 2+/5, painful in low back  Hip abduction (sidelying) 3/5, painful in posterior knee 3+/5, painful  Hip adduction    Hip internal rotation    Hip external rotation    Knee flexion (seated) Highly painful, 3+/5 4/5  Knee extension (seated) 4+/5 4+/5  Ankle dorsiflexion (seated) 3+/5 4/5  Ankle plantarflexion    Ankle inversion    Ankle eversion     (Blank rows = not tested)  04/14/2024 Supine SLR: endorsing tightness in hamstrings and slight pain in low back  FUNCTIONAL TESTS:  5 times sit to stand: 15.91s with use of bilat UE on arms of chair   GAIT: Distance walked: not objectively measured  Assistive device utilized: None Level of assistance: supervision  Comments: antalgic gait pattern with bilat lateral lean with stance phases    Lumbar ROM:  05/03/2024 Flexion: WFL, noted tightness in bilat hamstrings  Extension: highly limited and painful  Rt lateral flexion: WFL, slight discomfort in middle of back  Lt lateral flexion: WFL, slight discomfort in middle of back   Rt rotation: WFL Lt rotation: WFL                                                                                                                                TREATMENT DATE:  05/03/2024 TherEx:  Nustep level 4 for 8 minutes with bilat LE and UE  Lumbar ROM assessed with results noted above  Sit<>stands with mat table at 23 2x10 no UE use  Seated LAQ with 2# weights 2x12 each LE  Seated hamstring curls with red TB 2x12  Sidelying clamshells with red TB 2x8 each side   04/14/2024 TherEx:  HEP handout provided with patient performing one set of seated hip abduction with red TB (provided for HEP) and hamstring stretch for appropriate form. Verbal and tactile cues provided.  PT demonstrated supine lower trunk rotations and discussed performing 5sec each side when getting up first thing in the morning to decrease stiffness and performing for 30sec toward end of day to increase mobility/decrease pain. Patient attempted to performing seated SLR, though highly painful upon quad contraction in posterior knee and medial patella. Patient then attempted to perform seated quad set, but continued to have pain. Activity discontinued and not added to HEP.       Self-Care:  POC and interaction of hip/ankle/knee   PATIENT EDUCATION:  Education details: HEP, POC, interactions  Person educated: Patient Education method: Explanation, Demonstration, Tactile cues, Verbal cues, and Handouts Education comprehension: verbalized understanding, returned demonstration, verbal cues required, and tactile cues required  HOME EXERCISE PROGRAM: Access Code: [email or web address] URL: [email or web address] Date: 05/03/2024 Prepared by: Susannah Daring  Exercises - Supine Lower Trunk  Rotation  - 1 x daily - 7 x weekly - 2 sets - 10 reps - Seated Hamstring Stretch  -  1 x daily - 7 x weekly - 2 sets - 30sec hold - Sit to Stand with Armchair  - 1 x daily - 7 x weekly - 2 sets - 10 reps - Clamshell with Resistance  - 1 x daily - 7 x weekly - 2 sets - 8 reps  ASSESSMENT:  CLINICAL IMPRESSION: Patient arrived to session noting improved symptoms in bilat knees, though does not see an improvement with lower back with walking or standing. Patient tolerated all activities this date with no increase in pain. Patient will continue to benefit from skilled PT.    OBJECTIVE IMPAIRMENTS: decreased activity tolerance, decreased balance, decreased coordination, decreased endurance, decreased mobility, difficulty walking, decreased ROM, decreased strength, impaired flexibility, impaired sensation, impaired vision/preception, improper body mechanics, postural dysfunction, obesity, and pain.   ACTIVITY LIMITATIONS: bending, sitting, standing, squatting, sleeping, stairs, transfers, and bed mobility  PARTICIPATION LIMITATIONS: cleaning, shopping, community activity, and occupation  PERSONAL FACTORS: Fitness, Past/current experiences, Time since onset of injury/illness/exacerbation, 3+ comorbidities: HLD, HTN, depression (major depressive disorder), DM2, arthritis, SLE, IBS, diverticulitis, mild nonproliferative diabetic retinopathy of both eyes, and body habitus are also affecting patient's functional outcome.   REHAB POTENTIAL: Fair chronic, highly painful  CLINICAL DECISION MAKING: Evolving/moderate complexity  EVALUATION COMPLEXITY: Moderate   GOALS: Goals reviewed with patient? Yes  SHORT TERM GOALS: Target date: 05/05/2024 Patient will show compliance with initial HEP. Baseline: Goal status: goal met, 05/03/2024  2.  Patient will report pain levels no greater than 7/10 in order to show an improvement in overall quality of life. Baseline:  Goal status: goal ongoing,  05/03/2024   LONG TERM GOALS: Target date: 06/23/2024  Patient will be independent with final HEP in order to maintain and progress upon functional gains made within PT. Baseline:  Goal status: INITIAL  2.  Patient will report pain levels no greater than 5/10 in order to show an improvement in overall quality of life. Baseline:  Goal status: INITIAL  3.  Patient will increase PSFS to at least 6.25 in order to show a significant improvement in subjective disability rating. Baseline:  Goal status: INITIAL  4.  Patient will improve 5xSTS to at least 12s in order to decrease fall risk. Baseline:  Goal status: INITIAL  5.  Patient will increase bilat hip extension to at least 3+/5 in order to improve overall functional mobility. Baseline:  Goal status: INITIAL     PLAN:  PT FREQUENCY: 1-2x/week  PT DURATION: 10 weeks  PLANNED INTERVENTIONS: [id number]- PT Re-evaluation, 97750- Physical Performance Testing, 97110-Therapeutic exercises, 97530- Therapeutic activity, [id number]- Neuromuscular re-education, 97535- Self Care, [id number]- Manual therapy, [id number]- Gait training, [phone]- Orthotic Initial, [phone]- Orthotic/Prosthetic subsequent, [phone]- Canalith repositioning, [phone]- Aquatic Therapy, [phone]- Electrical stimulation (unattended), [phone]- Electrical stimulation (manual), [id number]- Vasopneumatic device, [id number]- Ultrasound, [id number]- Traction (mechanical), [id number]- Ionotophoresis 4mg /ml Dexamethasone, [id number] (1-2 muscles), [id number] (3+ muscles)- Dry Needling, Patient/Family education, Balance training, Stair training, Taping, Joint mobilization, Joint manipulation, Spinal manipulation, Spinal mobilization, Vestibular training, DME instructions, Cryotherapy, and Moist heat  PLAN FOR NEXT SESSION:  LE strength and mobility (within pain levels), functional mobility , lumbar extension, core stabilization    Susannah Daring, PT, DPT 05/03/2024 1:46 PM    "

## 2024-05-03 ENCOUNTER — Ambulatory Visit

## 2024-05-03 DIAGNOSIS — M25552 Pain in left hip: Secondary | ICD-10-CM | POA: Diagnosis not present

## 2024-05-03 DIAGNOSIS — R2689 Other abnormalities of gait and mobility: Secondary | ICD-10-CM

## 2024-05-03 DIAGNOSIS — R2681 Unsteadiness on feet: Secondary | ICD-10-CM | POA: Diagnosis not present

## 2024-05-03 DIAGNOSIS — M6281 Muscle weakness (generalized): Secondary | ICD-10-CM | POA: Diagnosis not present

## 2024-05-03 DIAGNOSIS — G8929 Other chronic pain: Secondary | ICD-10-CM

## 2024-05-03 DIAGNOSIS — M25551 Pain in right hip: Secondary | ICD-10-CM

## 2024-05-03 DIAGNOSIS — M25561 Pain in right knee: Secondary | ICD-10-CM

## 2024-05-03 DIAGNOSIS — M25562 Pain in left knee: Secondary | ICD-10-CM | POA: Diagnosis not present

## 2024-05-03 DIAGNOSIS — M5459 Other low back pain: Secondary | ICD-10-CM

## 2024-05-04 ENCOUNTER — Encounter: Admitting: Rehabilitative and Restorative Service Providers"

## 2024-05-09 ENCOUNTER — Encounter: Admitting: Physical Therapy

## 2024-05-09 ENCOUNTER — Ambulatory Visit (HOSPITAL_COMMUNITY): Admission: EM | Admit: 2024-05-09 | Discharge: 2024-05-09 | Disposition: A | Discharge status: Home / Self Care

## 2024-05-09 ENCOUNTER — Ambulatory Visit (INDEPENDENT_AMBULATORY_CARE_PROVIDER_SITE_OTHER)

## 2024-05-09 ENCOUNTER — Encounter (HOSPITAL_COMMUNITY): Payer: Self-pay

## 2024-05-09 DIAGNOSIS — M25561 Pain in right knee: Secondary | ICD-10-CM

## 2024-05-09 DIAGNOSIS — M1711 Unilateral primary osteoarthritis, right knee: Secondary | ICD-10-CM

## 2024-05-09 MED ORDER — ACETAMINOPHEN 325 MG PO TABS
ORAL_TABLET | ORAL | Status: AC
  Filled 2024-05-09: qty 3

## 2024-05-09 MED ORDER — ACETAMINOPHEN 325 MG PO TABS
975.0000 mg | ORAL_TABLET | Freq: Once | ORAL | Status: AC
  Administered 2024-05-09: 975 mg via ORAL

## 2024-05-09 NOTE — Discharge Instructions (Addendum)
 Your xray is negative for fracture You have osteoarthritis of right knee Keep appt as scheduled with Orthopedics-if worsening symptoms call to see if you can be seen sooner than 01/28 appt. Take tylenol  as label directed for pain Rest,ice,ace wrap for comfort

## 2024-05-09 NOTE — ED Provider Notes (Signed)
 " [facility]    CSN: [id number] Arrival date & time: 05/09/24  1234      History   Chief Complaint Chief Complaint  Patient presents with   Fall    HPI Stacy Dickerson is a 69 y.o. female.   Stacy Dickerson, 70 year old female, presents to urgent care for evaluation of right knee pain from fall this am. Pt thinks she twisted her knee.  Patient has a MRI scheduled for Thursday recently had a cortisone injection and has orthopedist.  She reports pain from sitting to standing and weightbearing, patient has full range of motion.  No meds taken prior to arrival, out of tylenol   Cancelled physical therapy appt for tomorrow Has f/u appt with orthopedics 1/28 appt  The history is provided by the patient. No language interpreter was used.  Fall    Past Medical History:  Diagnosis Date   Allergic rhinitis, unspecified    Arthritis    BMI 40.0-44.9, adult (HCC)    Chronic fatigue    Diabetes mellitus without complication (HCC)    Diverticulitis    Hyperlipidemia    Hypertension    IBS (irritable bowel syndrome)    Lupus    Major depressive disorder, recurrent episode, moderate (HCC)    Mild nonproliferative diabetic retinopathy of both eyes without macular edema (HCC)    Morbid obesity (HCC)    SLE (systemic lupus erythematosus related syndrome) [facility])     Patient Active Problem List   Diagnosis Date Noted   Acute pain of right knee 05/09/2024   Osteoarthritis of right knee 05/09/2024   Hypertension 06/04/2023   Diabetes mellitus with coincident hypertension (HCC) 06/03/2022   Morbid obesity (HCC) 07/19/2020   Mixed hyperlipidemia 05/03/2020   Systemic lupus erythematosus (HCC) 05/03/2020    Past Surgical History:  Procedure Laterality Date   BREAST BIOPSY Left 2015   US  guided   CHOLECYSTECTOMY     COLONOSCOPY  01/2024   DIAGNOSTIC LAPAROSCOPY     MYOMECTOMY     TONSILLECTOMY      OB History   No obstetric history on file.      Home Medications     Prior to Admission medications  Medication Sig Start Date End Date Taking? Authorizing Provider  minoxidil (LONITEN) 2.5 MG tablet Take 1.25 mg by mouth daily. 04/27/24  Yes [provider]  simvastatin (ZOCOR) 20 MG tablet Take 20 mg by mouth at bedtime. 05/05/24  Yes [provider]  acetaminophen  (TYLENOL ) 500 MG tablet Take 500 mg by mouth every 6 (six) hours as needed.    [provider]  BIOTIN PO Take by mouth.    [provider]  colestipol (COLESTID) 1 g tablet Take 1 g by mouth 2 (two) times daily. 03/21/24   [provider]  Continuous Glucose Sensor (FREESTYLE LIBRE 3 PLUS SENSOR) MISC CHECK BLOOD SUGAR THREE TO FOUR TIMES DAILY 03/09/23   [provider]  fluticasone (FLONASE ALLERGY RELIEF) 50 MCG/ACT nasal spray as needed.    [provider]  furosemide (LASIX) 20 MG tablet Take 20 mg by mouth daily. 09/02/23   [provider]  hydroxychloroquine (PLAQUENIL) 200 MG tablet Take 200 mg by mouth 2 (two) times daily. 03/31/22   [provider]  JARDIANCE 25 MG TABS tablet Take 25 mg by mouth daily. 09/16/23   [provider]  Menthol, Topical Analgesic, (BIOFREEZE EX) Apply topically as needed.    [provider]  potassium chloride (K-DUR)  10 MEQ tablet Take 10 mEq by mouth every morning.  10/29/14   [provider]  tirzepatide (MOUNJARO) 2.5 MG/0.5ML Pen once a week.    [provider]  valsartan (DIOVAN) 80 MG tablet Take 80 mg by mouth daily. 02/09/23   [provider]  VITAMIN D , CHOLECALCIFEROL, PO Take 5,000 Units by mouth daily.    [provider]    Family History Family History  Problem Relation Age of Onset   Breast cancer Mother 18   Cancer - Lung Mother    Alzheimer's disease Mother    Diabetes Mother    Hypertension Mother    Throat cancer Father    Prostate cancer Father     Social History Social History[1]   Allergies    Ciprofloxacin, Keflex [cephalexin], Lisinopril, and Sulfa antibiotics   Review of Systems Review of Systems  Constitutional:  Negative for fever.  Musculoskeletal:  Positive for arthralgias and gait problem.  Skin: Negative.   All other systems reviewed and are negative.    Physical Exam Triage Vital Signs ED Triage Vitals  Encounter Vitals Group     BP 05/09/24 1400 119/78     Girls Systolic BP Percentile --      Girls Diastolic BP Percentile --      Boys Systolic BP Percentile --      Boys Diastolic BP Percentile --      Pulse Rate 05/09/24 1400 76     Resp 05/09/24 1400 16     Temp 05/09/24 1400 99.6 F (37.6 C)     Temp Source 05/09/24 1400 Oral     SpO2 05/09/24 1400 96 %     Weight --      Height --      Head Circumference --      Peak Flow --      Pain Score 05/09/24 1357 10     Pain Loc --      Pain Education --      Exclude from Growth Chart --    No data found.  Updated Vital Signs BP 119/78 (BP Location: Left Arm)   Pulse 76   Temp 99.6 F (37.6 C) (Oral)   Resp 16   SpO2 96%   Visual Acuity Right Eye Distance:   Left Eye Distance:   Bilateral Distance:    Right Eye Near:   Left Eye Near:    Bilateral Near:     Physical Exam Vitals and nursing note reviewed.  Musculoskeletal:     Right knee: Bony tenderness present. Normal pulse.     Comments: No ecchymosis or obvious swelling noted  Neurological:     General: No focal deficit present.     Mental Status: She is alert and oriented to person, place, and time.     GCS: GCS eye subscore is 4. GCS verbal subscore is 5. GCS motor subscore is 6.      UC Treatments / Results  Labs (all labs ordered are listed, but only abnormal results are displayed) Labs Reviewed - No data to display  EKG   Radiology DG Knee Complete 4 Views Right Result Date: 05/09/2024 CLINICAL DATA:  Clemens, pain EXAM: RIGHT KNEE - COMPLETE 4+ VIEW COMPARISON:  04/26/2024 FINDINGS: Frontal, bilateral oblique,  lateral views of the right knee are obtained. No acute fracture, subluxation, or dislocation. Moderate 3 compartmental osteoarthritis. No joint effusion. Soft tissues are unremarkable. IMPRESSION: 1. Moderate 3 compartmental osteoarthritis. 2. No acute fracture. Electronically Signed  By: Ozell Daring M.D.   On: 05/09/2024 14:55    Procedures Procedures (including critical care time)  Medications Ordered in UC Medications  acetaminophen  (TYLENOL ) tablet 975 mg (975 mg Oral Given 05/09/24 1507)    Initial Impression / Assessment and Plan / UC Course  I have reviewed the triage vital signs and the nursing notes.  Pertinent labs & imaging results that were available during my care of the patient were reviewed by me and considered in my medical decision making (see chart for details).  Clinical Course as of 05/09/24 2131  Tue May 09, 2024  1419 To xray [JD]    Clinical Course User Index [JD] Fatma Rutten, Rilla, NP   Discussed exam findings and plan of care with patient :your xray is negative for fracture You have osteoarthritis of right knee Keep appt as scheduled with Orthopedics-if worsening symptoms call to see if you can be seen sooner than 01/28 appt. Take tylenol  as label directed for pain Rest,ice,ace wrap for comfort.  Pt Verbalized understanding to this provider.  Ddx: Right knee pain, fall, osteoarthritis, fracture, sprain Final Clinical Impressions(s) / UC Diagnoses   Final diagnoses:  Acute pain of right knee  Osteoarthritis of right knee, unspecified osteoarthritis type     Discharge Instructions      Your xray is negative for fracture You have osteoarthritis of right knee Keep appt as scheduled with Orthopedics-if worsening symptoms call to see if you can be seen sooner than 01/28 appt. Take tylenol  as label directed for pain Rest,ice,ace wrap for comfort     ED Prescriptions   None    PDMP not reviewed this encounter.     [1]  Social  History Tobacco Use   Smoking status: Never    Passive exposure: Past   Smokeless tobacco: Never  Vaping Use   Vaping status: Never Used  Substance Use Topics   Alcohol use: Yes    Alcohol/week: 1.0 standard drink of alcohol    Types: 1 Standard drinks or equivalent per week    Comment: once yearly   Drug use: Never     Aminta Rilla, NP 05/09/24 2131  "

## 2024-05-09 NOTE — ED Triage Notes (Signed)
 Patient here today with c/o right knee pain after a fall this morning. Patient states that her knee gave out on her after trying to get up from the commode. Patient has a h/o right knee pain and has an MRI scheduled for Thursday. Patient recently had a cortisone injection. Patient is now having increased pain after the fall. Increased pain with weightbearing and going from sitting to standing.

## 2024-05-09 NOTE — Therapy (Incomplete)
 " OUTPATIENT PHYSICAL THERAPY LOWER EXTREMITY TREATMENT   Patient Name: Stacy Dickerson MRN: [id number] DOB:06-Aug-1955, 69 y.o., female Today's Date: 05/09/2024  END OF SESSION:     Past Medical History:  Diagnosis Date   Allergic rhinitis, unspecified    Arthritis    BMI 40.0-44.9, adult (HCC)    Chronic fatigue    Diabetes mellitus without complication (HCC)    Diverticulitis    Hyperlipidemia    Hypertension    IBS (irritable bowel syndrome)    Lupus    Major depressive disorder, recurrent episode, moderate (HCC)    Mild nonproliferative diabetic retinopathy of both eyes without macular edema (HCC)    Morbid obesity (HCC)    SLE (systemic lupus erythematosus related syndrome) (HCC)    Past Surgical History:  Procedure Laterality Date   BREAST BIOPSY Left 2015   US  guided   CHOLECYSTECTOMY     COLONOSCOPY  01/2024   DIAGNOSTIC LAPAROSCOPY     MYOMECTOMY     TONSILLECTOMY     Patient Active Problem List   Diagnosis Date Noted   Hypertension 06/04/2023   Diabetes mellitus with coincident hypertension (HCC) 06/03/2022   Morbid obesity (HCC) 07/19/2020   Mixed hyperlipidemia 05/03/2020   Systemic lupus erythematosus (HCC) 05/03/2020    PCP: Reena Bucco, MD   REFERRING PROVIDER: Maya Nash, MD   REFERRING DIAG: M16.0 (ICD-10-CM) - Primary osteoarthritis of both hips M17.0 (ICD-10-CM) - Primary osteoarthritis of both knees  THERAPY DIAG:  No diagnosis found.  Rationale for Evaluation and Treatment: Rehabilitation  ONSET DATE: chronic, though exacerbated in October   SUBJECTIVE:   SUBJECTIVE STATEMENT: ***  PERTINENT HISTORY: Patient reports chronic bilat knee, bilat hip, and low back pain. Patient reports pain worsened in October after a trip that involved an increased amount of walking leading to worsening Rt knee pain. Patient endorses having received injections in bilat knees in the past (with last being in Mat/June of last year). Patient  reports no other injuries, trauma, or surgeries to bilat knees, bilat hips, or low back. Patient endorses difficulty with pain feeling like it is in the whole leg, knees feeling like they will give out while going up and down stairs, and stiffness when sitting for too long.   See PMH or personal factors for in depth comorbidities   PAIN:  Are you having pain? Yes: NPRS scale: Rt knee 2/10, Lt knee 1/10, low back 8.5/10  Pain location: Rt knee>Lt knee, Lt hip, low back  Pain description: aching all the time with intermittent sharp shooting pain  Aggravating factors: standing from low seats, sleeping, mobility after sitting for too long, being on knees    Relieving factors: stretching helps with stiffness, analgesic ointment (only helped with muscle pain)   PRECAUTIONS: Fall  RED FLAGS: Bowel or bladder incontinence: Yes: not spinal cord related; being followed by gastro and Cauda equina syndrome: No   WEIGHT BEARING RESTRICTIONS: No  FALLS:  Has patient fallen in last 6 months? 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 Vernetta for first time   OBJECTIVE:  Note: Objective measures were completed at Evaluation unless otherwise noted.  DIAGNOSTIC FINDINGS:  Moderate narrowing of bilateral hip joints was noted.  More prominent on  the left side.  No SI joint narrowing or sclerosis was noted.   Degenerative changes were noted in the lumbar spine. Impression: Moderate osteoarthritis was noted in  bilateral hips.   Lt knee: Moderate to severe medial compartment narrowing with medial and  intercondylar osteophytes was noted.  Moderate patellofemoral narrowing  was noted. Impression: These findings are suggestive of moderate to severe  osteoarthritis and moderate chondromalacia patella.   Rt knee:Moderate medial compartment narrowing and mild patellofemoral narrowing  was noted.  No chondrocalcinosis was noted. Impression: These findings suggestive of moderate osteoarthritis and mild  chondromalacia patella.   PATIENT SURVEYS:  PSFS: THE PATIENT SPECIFIC FUNCTIONAL SCALE  Place score of 0-10 (0 = unable to perform activity and 10 = able to perform activity at the same level as before injury or problem)  Activity Date: 04/14/2024    Walking up and down stairs  3    2. Standing from sofa and armless chair  3    3. Walking  4    4.  Sleeping comfortably  7    Total Score 4.25      Total Score = Sum of activity scores/number of activities  Minimally Detectable Change: 3 points (for single activity); 2 points (for average score)  [facility] (nd). The Patient Specific Functional Scale . Retrieved from [email or web address]   COGNITION: Overall cognitive status: Within functional limits for tasks assessed     SENSATION: Light touch: WFL Patient endorses n/t in big toe on Lt foot secondary to neuropathy; lying down leads to n/t in bottom of bilat feet    MUSCLE LENGTH: Not objectively measured, though tightness noted with supine SLR   POSTURE: rounded shoulders, forward head, and increased thoracic kyphosis  PALPATION: TTP over sacrum and bilat PSIS   LOWER EXTREMITY ROM:  Passive ROM Right Eval 04/14/2024 Left Eval 04/14/2024  Hip flexion (supine) [facility] WFL  Hip extension    Hip abduction    Hip adduction    Hip internal rotation (supine) WFL Limited 75% and highly painful  Hip external rotation  (supine) [facility] WFL  Knee flexion (supine) [facility] WFL  Knee extension (supine) [facility] WFL  Ankle dorsiflexion    Ankle plantarflexion    Ankle inversion    Ankle eversion     (Blank rows = not tested)  LOWER EXTREMITY MMT:  MMT Right Eval 04/14/2024 Left Eval 04/14/2024  Hip flexion (seated) 3/5, painful in hip 3+/5  Hip extension (prone) 2+/5, painful in low back 2+/5, painful in low back  Hip abduction (sidelying) 3/5, painful in posterior knee 3+/5, painful  Hip adduction    Hip internal rotation    Hip external rotation    Knee flexion (seated) Highly painful, 3+/5 4/5  Knee extension (seated) 4+/5 4+/5  Ankle dorsiflexion (seated) 3+/5 4/5  Ankle plantarflexion    Ankle inversion    Ankle eversion     (Blank rows = not tested)  04/14/2024 Supine SLR: endorsing tightness in hamstrings and slight pain in low back  FUNCTIONAL TESTS:  5 times sit to stand: 15.91s with use of bilat UE on arms of chair   GAIT: Distance walked: not objectively measured  Assistive device utilized: None Level of assistance: supervision  Comments: antalgic gait pattern with  bilat lateral lean with stance phases    Lumbar ROM: 05/03/2024 Flexion: WFL, noted tightness in bilat hamstrings  Extension: highly limited and painful  Rt lateral flexion: WFL, slight discomfort in middle of back  Lt lateral flexion: WFL, slight discomfort in middle of back   Rt rotation: WFL Lt rotation: WFL                                                                                                                                TREATMENT DATE:  05/10/2024 ***    05/03/2024 TherEx:  Nustep level 4 for 8 minutes with bilat LE and UE  Lumbar ROM assessed with results noted above  Sit<>stands with mat table at 23 2x10 no UE use  Seated LAQ with 2# weights 2x12 each LE  Seated hamstring curls with red TB 2x12  Sidelying clamshells with red TB 2x8 each side   04/14/2024 TherEx:  HEP handout provided with  patient performing one set of seated hip abduction with red TB (provided for HEP) and hamstring stretch for appropriate form. Verbal and tactile cues provided.  PT demonstrated supine lower trunk rotations and discussed performing 5sec each side when getting up first thing in the morning to decrease stiffness and performing for 30sec toward end of day to increase mobility/decrease pain. Patient attempted to performing seated SLR, though highly painful upon quad contraction in posterior knee and medial patella. Patient then attempted to perform seated quad set, but continued to have pain. Activity discontinued and not added to HEP.       Self-Care:  POC and interaction of hip/ankle/knee   PATIENT EDUCATION:  Education details: HEP, POC, interactions  Person educated: Patient Education method: Explanation, Demonstration, Tactile cues, Verbal cues, and Handouts Education comprehension: verbalized understanding, returned demonstration, verbal cues required, and tactile cues required  HOME EXERCISE PROGRAM: Access Code: [email or web address] URL: [email or web address] Date: 05/03/2024 Prepared by: Susannah Daring  Exercises - Supine Lower Trunk Rotation  - 1 x daily - 7 x weekly - 2 sets - 10 reps - Seated Hamstring Stretch  - 1 x daily - 7 x weekly - 2 sets - 30sec hold - Sit to Stand with Armchair  - 1 x daily - 7 x weekly - 2 sets - 10 reps - Clamshell with Resistance  - 1 x daily - 7 x weekly - 2 sets - 8 reps  ASSESSMENT:  CLINICAL IMPRESSION: Patient arrived to session noting ***. Patient will continue to benefit from skilled PT.    OBJECTIVE IMPAIRMENTS: decreased activity tolerance, decreased balance, decreased coordination, decreased endurance, decreased mobility, difficulty walking, decreased ROM, decreased strength, impaired flexibility, impaired sensation, impaired vision/preception, improper body mechanics, postural dysfunction, obesity, and pain.   ACTIVITY LIMITATIONS:  bending, sitting, standing, squatting, sleeping, stairs, transfers, and bed mobility  PARTICIPATION LIMITATIONS: cleaning, shopping, community activity, and occupation  PERSONAL FACTORS: Fitness, Past/current experiences, Time since onset of injury/illness/exacerbation, 3+ comorbidities: HLD, HTN, depression (major  depressive disorder), DM2, arthritis, SLE, IBS, diverticulitis, mild nonproliferative diabetic retinopathy of both eyes, and body habitus are also affecting patient's functional outcome.   REHAB POTENTIAL: Fair chronic, highly painful  CLINICAL DECISION MAKING: Evolving/moderate complexity  EVALUATION COMPLEXITY: Moderate   GOALS: Goals reviewed with patient? Yes  SHORT TERM GOALS: Target date: 05/05/2024 Patient will show compliance with initial HEP. Baseline: Goal status: goal met, 05/03/2024  2.  Patient will report pain levels no greater than 7/10 in order to show an improvement in overall quality of life. Baseline:  Goal status: goal ongoing, 05/03/2024   LONG TERM GOALS: Target date: 06/23/2024  Patient will be independent with final HEP in order to maintain and progress upon functional gains made within PT. Baseline:  Goal status: INITIAL  2.  Patient will report pain levels no greater than 5/10 in order to show an improvement in overall quality of life. Baseline:  Goal status: INITIAL  3.  Patient will increase PSFS to at least 6.25 in order to show a significant improvement in subjective disability rating. Baseline:  Goal status: INITIAL  4.  Patient will improve 5xSTS to at least 12s in order to decrease fall risk. Baseline:  Goal status: INITIAL  5.  Patient will increase bilat hip extension to at least 3+/5 in order to improve overall functional mobility. Baseline:  Goal status: INITIAL     PLAN:  PT FREQUENCY: 1-2x/week  PT DURATION: 10 weeks  PLANNED INTERVENTIONS: [id number]- PT Re-evaluation, 97750- Physical Performance Testing,  97110-Therapeutic exercises, 97530- Therapeutic activity, [id number]- Neuromuscular re-education, 97535- Self Care, [id number]- Manual therapy, [id number]- Gait training, [phone]- Orthotic Initial, [phone]- Orthotic/Prosthetic subsequent, [phone]- Canalith repositioning, [phone]- Aquatic Therapy, [phone]- Electrical stimulation (unattended), [phone]- Electrical stimulation (manual), [id number]- Vasopneumatic device, [id number]- Ultrasound, [id number]- Traction (mechanical), [id number]- Ionotophoresis 4mg /ml Dexamethasone, [id number] (1-2 muscles), [id number] (3+ muscles)- Dry Needling, Patient/Family education, Balance training, Stair training, Taping, Joint mobilization, Joint manipulation, Spinal manipulation, Spinal mobilization, Vestibular training, DME instructions, Cryotherapy, and Moist heat  PLAN FOR NEXT SESSION:  *** LE strength and mobility (within pain levels), functional mobility , lumbar extension, core stabilization    Susannah Daring, PT, DPT 05/09/2024 7:05 AM    "

## 2024-05-10 ENCOUNTER — Encounter

## 2024-05-11 ENCOUNTER — Ambulatory Visit
Admission: RE | Admit: 2024-05-11 | Discharge: 2024-05-11 | Disposition: A | Source: Ambulatory Visit | Attending: Orthopaedic Surgery | Admitting: Orthopaedic Surgery

## 2024-05-11 DIAGNOSIS — G8929 Other chronic pain: Secondary | ICD-10-CM

## 2024-05-16 ENCOUNTER — Telehealth: Payer: Self-pay | Admitting: Orthopaedic Surgery

## 2024-05-16 ENCOUNTER — Encounter: Admitting: Physical Therapy

## 2024-05-16 NOTE — Telephone Encounter (Signed)
 Patient aware

## 2024-05-16 NOTE — Telephone Encounter (Signed)
 Patient called. She fell, says she needs to see [facility] ASAP. Would like a call back.

## 2024-05-17 ENCOUNTER — Encounter

## 2024-05-23 ENCOUNTER — Telehealth: Payer: Self-pay

## 2024-05-23 ENCOUNTER — Encounter: Admitting: Physical Therapy

## 2024-05-23 NOTE — Telephone Encounter (Signed)
 FYI she made herself an appt on MyChart for tomorrow. Didn't know if we need to order something for her instead of her coming in and us  not being able to do anything?

## 2024-05-24 ENCOUNTER — Encounter

## 2024-05-24 ENCOUNTER — Ambulatory Visit: Admitting: Orthopaedic Surgery

## 2024-05-29 ENCOUNTER — Encounter

## 2024-05-31 ENCOUNTER — Encounter

## 2024-06-07 ENCOUNTER — Ambulatory Visit: Admitting: Physician Assistant

## 2024-06-14 ENCOUNTER — Ambulatory Visit: Admitting: Orthopaedic Surgery

## 2024-09-27 ENCOUNTER — Ambulatory Visit: Admitting: Rheumatology
